# Patient Record
Sex: Female | Born: 1955 | Race: White | Hispanic: No | Smoking: Never smoker
Health system: Southern US, Community
[De-identification: ages and names within clinical notes are randomized; demographics above are authoritative.]

## PROBLEM LIST (undated history)

## (undated) DIAGNOSIS — E119 Type 2 diabetes mellitus without complications: Secondary | ICD-10-CM

## (undated) DIAGNOSIS — E039 Hypothyroidism, unspecified: Secondary | ICD-10-CM

## (undated) DIAGNOSIS — E079 Disorder of thyroid, unspecified: Secondary | ICD-10-CM

## (undated) DIAGNOSIS — E785 Hyperlipidemia, unspecified: Secondary | ICD-10-CM

## (undated) HISTORY — DX: Hyperlipidemia, unspecified: E78.5

## (undated) HISTORY — DX: Disorder of thyroid, unspecified: E07.9

## (undated) HISTORY — DX: Type 2 diabetes mellitus without complications: E11.9

## (undated) HISTORY — PX: COLONOSCOPY: SHX174

---

## 2001-09-20 ENCOUNTER — Encounter: Payer: Self-pay | Admitting: Obstetrics and Gynecology

## 2001-09-20 ENCOUNTER — Ambulatory Visit (HOSPITAL_COMMUNITY): Admission: RE | Admit: 2001-09-20 | Discharge: 2001-09-20 | Payer: Self-pay | Admitting: Obstetrics and Gynecology

## 2002-09-21 ENCOUNTER — Encounter: Payer: Self-pay | Admitting: Obstetrics & Gynecology

## 2002-09-21 ENCOUNTER — Ambulatory Visit (HOSPITAL_COMMUNITY): Admission: RE | Admit: 2002-09-21 | Discharge: 2002-09-21 | Payer: Self-pay | Admitting: Obstetrics & Gynecology

## 2004-10-22 ENCOUNTER — Ambulatory Visit (HOSPITAL_COMMUNITY): Admission: RE | Admit: 2004-10-22 | Discharge: 2004-10-22 | Payer: Self-pay | Admitting: Obstetrics and Gynecology

## 2006-05-11 ENCOUNTER — Ambulatory Visit (HOSPITAL_COMMUNITY): Admission: RE | Admit: 2006-05-11 | Discharge: 2006-05-11 | Payer: Self-pay | Admitting: Obstetrics and Gynecology

## 2007-10-31 ENCOUNTER — Ambulatory Visit (HOSPITAL_COMMUNITY): Admission: RE | Admit: 2007-10-31 | Discharge: 2007-10-31 | Payer: Self-pay | Admitting: Obstetrics and Gynecology

## 2007-11-21 ENCOUNTER — Other Ambulatory Visit: Admission: RE | Admit: 2007-11-21 | Discharge: 2007-11-21 | Payer: Self-pay | Admitting: Obstetrics and Gynecology

## 2009-12-25 ENCOUNTER — Ambulatory Visit (HOSPITAL_COMMUNITY): Admission: RE | Admit: 2009-12-25 | Discharge: 2009-12-25 | Payer: Self-pay | Admitting: Obstetrics and Gynecology

## 2010-02-18 ENCOUNTER — Other Ambulatory Visit: Admission: RE | Admit: 2010-02-18 | Discharge: 2010-02-18 | Payer: Self-pay | Admitting: Obstetrics and Gynecology

## 2011-08-05 ENCOUNTER — Other Ambulatory Visit (HOSPITAL_COMMUNITY): Payer: Self-pay | Admitting: Physician Assistant

## 2011-08-05 DIAGNOSIS — M5137 Other intervertebral disc degeneration, lumbosacral region: Secondary | ICD-10-CM

## 2011-08-05 DIAGNOSIS — Z8546 Personal history of malignant neoplasm of prostate: Secondary | ICD-10-CM

## 2011-08-05 DIAGNOSIS — IMO0002 Reserved for concepts with insufficient information to code with codable children: Secondary | ICD-10-CM

## 2011-08-05 DIAGNOSIS — M51379 Other intervertebral disc degeneration, lumbosacral region without mention of lumbar back pain or lower extremity pain: Secondary | ICD-10-CM

## 2012-02-16 ENCOUNTER — Other Ambulatory Visit: Payer: Self-pay | Admitting: Obstetrics and Gynecology

## 2012-02-16 DIAGNOSIS — IMO0001 Reserved for inherently not codable concepts without codable children: Secondary | ICD-10-CM

## 2012-02-21 ENCOUNTER — Ambulatory Visit (HOSPITAL_COMMUNITY)
Admission: RE | Admit: 2012-02-21 | Discharge: 2012-02-21 | Disposition: A | Discharge status: Home / Self Care | Payer: BC Managed Care – PPO | Source: Ambulatory Visit | Attending: Obstetrics and Gynecology | Admitting: Obstetrics and Gynecology

## 2012-02-21 DIAGNOSIS — IMO0001 Reserved for inherently not codable concepts without codable children: Secondary | ICD-10-CM

## 2012-02-21 DIAGNOSIS — Z1231 Encounter for screening mammogram for malignant neoplasm of breast: Secondary | ICD-10-CM | POA: Insufficient documentation

## 2012-03-13 ENCOUNTER — Other Ambulatory Visit: Payer: Self-pay | Admitting: Adult Health

## 2012-03-13 ENCOUNTER — Other Ambulatory Visit (HOSPITAL_COMMUNITY)
Admission: RE | Admit: 2012-03-13 | Discharge: 2012-03-13 | Disposition: A | Discharge status: Home / Self Care | Payer: BC Managed Care – PPO | Source: Ambulatory Visit | Attending: Obstetrics and Gynecology | Admitting: Obstetrics and Gynecology

## 2012-03-13 DIAGNOSIS — Z1151 Encounter for screening for human papillomavirus (HPV): Secondary | ICD-10-CM | POA: Insufficient documentation

## 2012-03-13 DIAGNOSIS — Z01419 Encounter for gynecological examination (general) (routine) without abnormal findings: Secondary | ICD-10-CM | POA: Insufficient documentation

## 2014-09-05 ENCOUNTER — Other Ambulatory Visit: Payer: Self-pay | Admitting: Obstetrics and Gynecology

## 2014-09-05 DIAGNOSIS — Z1231 Encounter for screening mammogram for malignant neoplasm of breast: Secondary | ICD-10-CM

## 2014-09-11 ENCOUNTER — Ambulatory Visit (HOSPITAL_COMMUNITY)
Admission: RE | Admit: 2014-09-11 | Discharge: 2014-09-11 | Disposition: A | Discharge status: Home / Self Care | Payer: BLUE CROSS/BLUE SHIELD | Source: Ambulatory Visit | Attending: Obstetrics and Gynecology | Admitting: Obstetrics and Gynecology

## 2014-09-11 DIAGNOSIS — Z1231 Encounter for screening mammogram for malignant neoplasm of breast: Secondary | ICD-10-CM | POA: Diagnosis present

## 2014-09-13 ENCOUNTER — Other Ambulatory Visit: Payer: Self-pay | Admitting: Obstetrics and Gynecology

## 2014-09-13 DIAGNOSIS — IMO0002 Reserved for concepts with insufficient information to code with codable children: Secondary | ICD-10-CM

## 2014-09-13 DIAGNOSIS — R229 Localized swelling, mass and lump, unspecified: Principal | ICD-10-CM

## 2014-09-17 ENCOUNTER — Other Ambulatory Visit: Payer: Self-pay | Admitting: Obstetrics and Gynecology

## 2014-09-17 ENCOUNTER — Ambulatory Visit (HOSPITAL_COMMUNITY)
Admission: RE | Admit: 2014-09-17 | Discharge: 2014-09-17 | Disposition: A | Discharge status: Home / Self Care | Payer: BLUE CROSS/BLUE SHIELD | Source: Ambulatory Visit | Attending: Obstetrics and Gynecology | Admitting: Obstetrics and Gynecology

## 2014-09-17 DIAGNOSIS — R229 Localized swelling, mass and lump, unspecified: Principal | ICD-10-CM

## 2014-09-17 DIAGNOSIS — R928 Other abnormal and inconclusive findings on diagnostic imaging of breast: Secondary | ICD-10-CM | POA: Insufficient documentation

## 2014-09-17 DIAGNOSIS — IMO0002 Reserved for concepts with insufficient information to code with codable children: Secondary | ICD-10-CM

## 2015-01-29 ENCOUNTER — Encounter: Payer: Self-pay | Admitting: Adult Health

## 2015-01-29 ENCOUNTER — Ambulatory Visit (INDEPENDENT_AMBULATORY_CARE_PROVIDER_SITE_OTHER): Payer: BLUE CROSS/BLUE SHIELD | Admitting: Adult Health

## 2015-01-29 ENCOUNTER — Other Ambulatory Visit (HOSPITAL_COMMUNITY)
Admission: RE | Admit: 2015-01-29 | Discharge: 2015-01-29 | Disposition: A | Discharge status: Home / Self Care | Payer: BLUE CROSS/BLUE SHIELD | Source: Ambulatory Visit | Attending: Adult Health | Admitting: Adult Health

## 2015-01-29 VITALS — BP 120/72 | HR 64 | Ht 65.25 in | Wt 154.0 lb

## 2015-01-29 DIAGNOSIS — Z01419 Encounter for gynecological examination (general) (routine) without abnormal findings: Secondary | ICD-10-CM

## 2015-01-29 DIAGNOSIS — Z1212 Encounter for screening for malignant neoplasm of rectum: Secondary | ICD-10-CM | POA: Diagnosis not present

## 2015-01-29 DIAGNOSIS — Z1151 Encounter for screening for human papillomavirus (HPV): Secondary | ICD-10-CM | POA: Diagnosis present

## 2015-01-29 LAB — HEMOCCULT GUIAC POC 1CARD (OFFICE): Fecal Occult Blood, POC: NEGATIVE

## 2015-01-29 NOTE — Progress Notes (Signed)
Patient ID: Krista Lambert, female   DOB: 11-25-1955, 59 y.o.   MRN: [id number] History of Present Illness: Krista Lambert is a 59 year old white female, married in for well woman gyn exam and pap. PCP is Dr Scotty Court.   Current Medications, Allergies, Past Medical History, Past Surgical History, Family History and Social History were reviewed in [employer] record.     Review of Systems: Patient denies any headaches, hearing loss, fatigue, blurred vision, shortness of breath, chest pain, abdominal pain, problems with bowel movements, urination, or intercourse. No joint pain or mood swings.    Physical Exam:BP 120/72 mmHg  Pulse 64  Ht 5' 5.25" (1.657 m)  Wt 154 lb (69.854 kg)  BMI 25.44 kg/m2 General:  Well developed, well nourished, no acute distress Skin:  Warm and dry Neck:  Midline trachea, normal thyroid, good ROM, no lymphadenopathy, no carotid bruits heard Lungs; Clear to auscultation bilaterally Breast:  No dominant palpable mass, retraction, or nipple discharge Cardiovascular: Regular rate and rhythm Abdomen:  Soft, non tender, no hepatosplenomegaly Pelvic:  External genitalia is normal in appearance, no lesions.  The vagina is normal in appearance. Urethra has no lesions or masses. The cervix is smooth and  bulbous, pap with HPV performed. Has first degree descensus. Uterus is felt to be normal size, shape, and contour.  No adnexal masses or tenderness noted.Bladder is non tender, no masses felt. Rectal: Good sphincter tone, no polyps, or hemorrhoids felt.  Hemoccult negative. Extremities/musculoskeletal:  No swelling or varicosities noted, no clubbing or cyanosis Psych:  No mood changes, alert and cooperative,seems happy   Impression: Well woman gyn exam with pap    Plan: Physical in 1 year Pap in 3 if normal pap and negative HPV Mammogram yearly Labs with PCP Colonoscopy per GI DEXA advised, can talk with Dr Scotty Court

## 2015-01-29 NOTE — Patient Instructions (Signed)
Physical in 1 year Pap in 3 years if normal with negative HPV Mammogram yearly Labs with PCP Colonoscopy per GI DEXA ---bone density recommended

## 2015-01-30 LAB — CYTOLOGY - PAP

## 2016-04-27 ENCOUNTER — Other Ambulatory Visit: Payer: Self-pay | Admitting: Obstetrics & Gynecology

## 2016-04-27 DIAGNOSIS — Z1231 Encounter for screening mammogram for malignant neoplasm of breast: Secondary | ICD-10-CM

## 2016-05-07 ENCOUNTER — Ambulatory Visit (INDEPENDENT_AMBULATORY_CARE_PROVIDER_SITE_OTHER): Payer: BLUE CROSS/BLUE SHIELD | Admitting: Adult Health

## 2016-05-07 ENCOUNTER — Ambulatory Visit (HOSPITAL_COMMUNITY)
Admission: RE | Admit: 2016-05-07 | Discharge: 2016-05-07 | Disposition: A | Discharge status: Home / Self Care | Payer: BLUE CROSS/BLUE SHIELD | Source: Ambulatory Visit | Attending: Obstetrics & Gynecology | Admitting: Obstetrics & Gynecology

## 2016-05-07 ENCOUNTER — Encounter: Payer: Self-pay | Admitting: Adult Health

## 2016-05-07 VITALS — BP 110/70 | HR 72 | Ht 65.25 in | Wt 153.0 lb

## 2016-05-07 DIAGNOSIS — Z1231 Encounter for screening mammogram for malignant neoplasm of breast: Secondary | ICD-10-CM | POA: Insufficient documentation

## 2016-05-07 DIAGNOSIS — Z01419 Encounter for gynecological examination (general) (routine) without abnormal findings: Secondary | ICD-10-CM

## 2016-05-07 DIAGNOSIS — Z1212 Encounter for screening for malignant neoplasm of rectum: Secondary | ICD-10-CM

## 2016-05-07 LAB — HEMOCCULT GUIAC POC 1CARD (OFFICE): FECAL OCCULT BLD: NEGATIVE

## 2016-05-07 NOTE — Progress Notes (Signed)
Patient ID: Krista Lambert, female   DOB: 1956-02-03, 60 y.o.   MRN: [id number] History of Present Illness: Eztli is a 60 year old white female,married in for a well woman gyn exam, she had a normal pap with negative HPV 01/29/15.  PCP is Dr Scotty Court.    Current Medications, Allergies, Past Medical History, Past Surgical History, Family History and Social History were reviewed in [employer] record.     Review of Systems: Patient denies any headaches, hearing loss, fatigue, blurred vision, shortness of breath, chest pain, abdominal pain, problems with bowel movements, urination, or intercourse. No joint pain or mood swings.    Physical Exam:BP 110/70 (BP Location: Left Arm, Patient Position: Sitting, Cuff Size: Normal)   Pulse 72   Ht 5' 5.25" (1.657 m)   Wt 153 lb (69.4 kg)   BMI 25.27 kg/m  General:  Well developed, well nourished, no acute distress Skin:  Warm and dry Neck:  Midline trachea, normal thyroid, good ROM, no lymphadenopathy,no carotid bruits heard Lungs; Clear to auscultation bilaterally Breast:  No dominant palpable mass, retraction, or nipple discharge Cardiovascular: Regular rate and rhythm Abdomen:  Soft, non tender, no hepatosplenomegaly Pelvic:  External genitalia is normal in appearance, no lesions.  The vagina is normal in appearance. Urethra has no lesions or masses. The cervix is bulbous, with some descensus.  Uterus is felt to be normal size, shape, and contour.  No adnexal masses or tenderness noted.Bladder is non tender, no masses felt. Rectal: Good sphincter tone, no polyps, or hemorrhoids felt.  Hemoccult negative. Extremities/musculoskeletal:  No swelling or varicosities noted, no clubbing or cyanosis Psych:  No mood changes, alert and cooperative,seems happy PHQ 2 score 0.  Impression: 1. Well woman exam with routine gynecological exam       Plan: Physical in 1 year Pap in 2019 Mammogram yearly(had today) Colonoscopy per  GI Labs with PCP

## 2016-05-07 NOTE — Patient Instructions (Signed)
Physical in 1 year Pap in 2019 Mammogram yearly Colonoscopy per GI Labs with PCP

## 2016-05-07 NOTE — Progress Notes (Signed)
Patient ID: Krista Lambert, female   DOB: 1955-07-02, 60 y.o.   MRN: [id number]

## 2017-11-22 ENCOUNTER — Other Ambulatory Visit: Payer: Self-pay | Admitting: Obstetrics & Gynecology

## 2017-11-22 DIAGNOSIS — Z1231 Encounter for screening mammogram for malignant neoplasm of breast: Secondary | ICD-10-CM

## 2017-11-30 ENCOUNTER — Encounter (HOSPITAL_COMMUNITY): Payer: Self-pay

## 2017-11-30 ENCOUNTER — Ambulatory Visit (HOSPITAL_COMMUNITY)
Admission: RE | Admit: 2017-11-30 | Discharge: 2017-11-30 | Disposition: A | Discharge status: Home / Self Care | Payer: BLUE CROSS/BLUE SHIELD | Source: Ambulatory Visit | Attending: Obstetrics & Gynecology | Admitting: Obstetrics & Gynecology

## 2017-11-30 DIAGNOSIS — Z1231 Encounter for screening mammogram for malignant neoplasm of breast: Secondary | ICD-10-CM | POA: Diagnosis present

## 2019-01-10 ENCOUNTER — Other Ambulatory Visit (HOSPITAL_COMMUNITY): Payer: Self-pay | Admitting: Obstetrics & Gynecology

## 2019-01-10 DIAGNOSIS — Z1231 Encounter for screening mammogram for malignant neoplasm of breast: Secondary | ICD-10-CM

## 2019-08-30 ENCOUNTER — Ambulatory Visit (HOSPITAL_COMMUNITY)
Admission: RE | Admit: 2019-08-30 | Discharge: 2019-08-30 | Disposition: A | Discharge status: Home / Self Care | Payer: BC Managed Care – PPO | Source: Ambulatory Visit | Attending: Obstetrics & Gynecology | Admitting: Obstetrics & Gynecology

## 2019-08-30 ENCOUNTER — Other Ambulatory Visit: Payer: Self-pay

## 2019-08-30 DIAGNOSIS — Z1231 Encounter for screening mammogram for malignant neoplasm of breast: Secondary | ICD-10-CM | POA: Diagnosis not present

## 2019-09-01 ENCOUNTER — Ambulatory Visit: Payer: BC Managed Care – PPO | Attending: Internal Medicine

## 2019-09-01 DIAGNOSIS — Z23 Encounter for immunization: Secondary | ICD-10-CM | POA: Insufficient documentation

## 2019-09-01 NOTE — Progress Notes (Signed)
   Covid-19 Vaccination Clinic  Name:  Krista Lambert    MRN: [id number] DOB: 1955-09-23  09/01/2019  [name] was observed post Covid-19 immunization for 15 minutes without incident. She was provided with Vaccine Information Sheet and instruction to access the V-Safe system.   [name] was instructed to call 911 with any severe reactions post vaccine: Marland Kitchen Difficulty breathing  . Swelling of face and throat  . A fast heartbeat  . A bad rash all over body  . Dizziness and weakness   Immunizations Administered    Name Date Dose VIS Date Route   Moderna COVID-19 Vaccine 09/01/2019  8:56 AM 0.5 mL 05/29/2019 Intramuscular   Manufacturer: Moderna   Lot: [id number]   [place]:[id number]

## 2019-10-03 ENCOUNTER — Ambulatory Visit: Payer: BC Managed Care – PPO | Attending: Internal Medicine

## 2019-10-03 DIAGNOSIS — Z23 Encounter for immunization: Secondary | ICD-10-CM

## 2019-10-03 NOTE — Progress Notes (Signed)
   Covid-19 Vaccination Clinic  Name:  Krista Lambert    MRN: [id number] DOB: 1955-08-16  10/03/2019  Krista Lambert was observed post Covid-19 immunization for 15 minutes without incident. She was provided with Vaccine Information Sheet and instruction to access the V-Safe system.   Krista Lambert was instructed to call 911 with any severe reactions post vaccine: Marland Kitchen Difficulty breathing  . Swelling of face and throat  . A fast heartbeat  . A bad rash all over body  . Dizziness and weakness   Immunizations Administered    Name Date Dose VIS Date Route   Moderna COVID-19 Vaccine 10/03/2019  8:40 AM 0.5 mL 05/29/2019 Intramuscular   Manufacturer: Levan Hurst   LotUD:[id number]   [place][id number]

## 2020-01-24 ENCOUNTER — Encounter (INDEPENDENT_AMBULATORY_CARE_PROVIDER_SITE_OTHER): Payer: Self-pay | Admitting: *Deleted

## 2020-02-26 ENCOUNTER — Encounter (INDEPENDENT_AMBULATORY_CARE_PROVIDER_SITE_OTHER): Payer: Self-pay | Admitting: *Deleted

## 2020-02-26 ENCOUNTER — Telehealth (INDEPENDENT_AMBULATORY_CARE_PROVIDER_SITE_OTHER): Payer: Self-pay | Admitting: *Deleted

## 2020-02-26 ENCOUNTER — Other Ambulatory Visit (INDEPENDENT_AMBULATORY_CARE_PROVIDER_SITE_OTHER): Payer: Self-pay | Admitting: *Deleted

## 2020-02-26 MED ORDER — PLENVU 140 G PO SOLR: 1.0000 | Freq: Once | ORAL | 0 refills | Status: AC

## 2020-02-26 NOTE — Telephone Encounter (Addendum)
Patient needs Plenvu (copay card) ° °

## 2020-03-07 ENCOUNTER — Other Ambulatory Visit (INDEPENDENT_AMBULATORY_CARE_PROVIDER_SITE_OTHER): Payer: Self-pay

## 2020-03-07 DIAGNOSIS — Z1211 Encounter for screening for malignant neoplasm of colon: Secondary | ICD-10-CM

## 2020-03-10 ENCOUNTER — Telehealth (INDEPENDENT_AMBULATORY_CARE_PROVIDER_SITE_OTHER): Payer: Self-pay | Admitting: *Deleted

## 2020-03-10 NOTE — Telephone Encounter (Signed)
Ok to schedule.  Thanks,  Chayton Murata Castaneda Mayorga, MD Gastroenterology and Hepatology [facility]  

## 2020-03-10 NOTE — Telephone Encounter (Signed)
Referring MD/PCP: rucker   Procedure: tcs  Reason/Indication:  screening  Has patient had this procedure before?  Yes, 2013  If so, when, by whom and where?    Is there a family history of colon cancer?  no  Who?  What age when diagnosed?    Is patient diabetic?   yes      Does patient have prosthetic heart valve or mechanical valve?  no  Do you have a pacemaker/defibrillator?  no  Has patient ever had endocarditis/atrial fibrillation? no  Does patient use oxygen? no  Has patient had joint replacement within last 12 months?  no  Is patient constipated or do they take laxatives? no  Does patient have a history of alcohol/drug use?  no  Is patient on blood thinner such as Coumadin, Plavix and/or Aspirin? yes  Medications: asa 81 mg daily, ibuprofen 200 mg bid, metformin 500 mg bid, levothyroxine 75 mcg daily, rosuvastatin 20 mg daily, mvi daily, co 1 10 daily, vit c daily, vit b12 daily, allegra daily, flonase daily, senokot daily, colace 100 mg 3 tab after evening meal  Allergies: miralax  Medication Adjustment per Dr Rehman/Dr Jenetta Downer hold metformin evening before and morning of proceudre  Procedure date & time: 04/08/20

## 2020-04-07 ENCOUNTER — Other Ambulatory Visit (HOSPITAL_COMMUNITY)
Admission: RE | Admit: 2020-04-07 | Discharge: 2020-04-07 | Disposition: A | Discharge status: Home / Self Care | Payer: BC Managed Care – PPO | Source: Ambulatory Visit | Attending: Gastroenterology | Admitting: Gastroenterology

## 2020-04-07 ENCOUNTER — Other Ambulatory Visit: Payer: Self-pay

## 2020-04-07 DIAGNOSIS — Z01812 Encounter for preprocedural laboratory examination: Secondary | ICD-10-CM | POA: Insufficient documentation

## 2020-04-07 DIAGNOSIS — Z20822 Contact with and (suspected) exposure to covid-19: Secondary | ICD-10-CM | POA: Diagnosis not present

## 2020-04-07 LAB — SARS CORONAVIRUS 2 (TAT 6-24 HRS): SARS Coronavirus 2: NEGATIVE

## 2020-04-08 ENCOUNTER — Other Ambulatory Visit (INDEPENDENT_AMBULATORY_CARE_PROVIDER_SITE_OTHER): Payer: Self-pay | Admitting: Gastroenterology

## 2020-04-08 ENCOUNTER — Other Ambulatory Visit: Payer: Self-pay

## 2020-04-08 ENCOUNTER — Ambulatory Visit (HOSPITAL_COMMUNITY): Payer: BC Managed Care – PPO | Admitting: Anesthesiology

## 2020-04-08 ENCOUNTER — Ambulatory Visit (HOSPITAL_COMMUNITY)
Admission: RE | Admit: 2020-04-08 | Discharge: 2020-04-08 | Disposition: A | Discharge status: Home / Self Care | Payer: BC Managed Care – PPO | Attending: Gastroenterology | Admitting: Gastroenterology

## 2020-04-08 ENCOUNTER — Encounter (HOSPITAL_COMMUNITY): Payer: Self-pay | Admitting: Gastroenterology

## 2020-04-08 ENCOUNTER — Encounter (HOSPITAL_COMMUNITY)
Admission: RE | Disposition: A | Discharge status: Home / Self Care | Payer: Self-pay | Source: Home / Self Care | Attending: Gastroenterology

## 2020-04-08 DIAGNOSIS — Z86010 Personal history of colonic polyps: Secondary | ICD-10-CM | POA: Diagnosis not present

## 2020-04-08 DIAGNOSIS — E039 Hypothyroidism, unspecified: Secondary | ICD-10-CM | POA: Diagnosis not present

## 2020-04-08 DIAGNOSIS — Z79899 Other long term (current) drug therapy: Secondary | ICD-10-CM | POA: Insufficient documentation

## 2020-04-08 DIAGNOSIS — Z7984 Long term (current) use of oral hypoglycemic drugs: Secondary | ICD-10-CM | POA: Insufficient documentation

## 2020-04-08 DIAGNOSIS — K5730 Diverticulosis of large intestine without perforation or abscess without bleeding: Secondary | ICD-10-CM | POA: Diagnosis not present

## 2020-04-08 DIAGNOSIS — D125 Benign neoplasm of sigmoid colon: Secondary | ICD-10-CM | POA: Insufficient documentation

## 2020-04-08 DIAGNOSIS — Z791 Long term (current) use of non-steroidal anti-inflammatories (NSAID): Secondary | ICD-10-CM | POA: Insufficient documentation

## 2020-04-08 DIAGNOSIS — K648 Other hemorrhoids: Secondary | ICD-10-CM | POA: Insufficient documentation

## 2020-04-08 DIAGNOSIS — Z7982 Long term (current) use of aspirin: Secondary | ICD-10-CM | POA: Insufficient documentation

## 2020-04-08 DIAGNOSIS — Z79890 Hormone replacement therapy (postmenopausal): Secondary | ICD-10-CM | POA: Diagnosis not present

## 2020-04-08 DIAGNOSIS — Z1211 Encounter for screening for malignant neoplasm of colon: Secondary | ICD-10-CM | POA: Insufficient documentation

## 2020-04-08 DIAGNOSIS — E119 Type 2 diabetes mellitus without complications: Secondary | ICD-10-CM | POA: Insufficient documentation

## 2020-04-08 DIAGNOSIS — Z888 Allergy status to other drugs, medicaments and biological substances status: Secondary | ICD-10-CM | POA: Diagnosis not present

## 2020-04-08 DIAGNOSIS — Z8379 Family history of other diseases of the digestive system: Secondary | ICD-10-CM | POA: Insufficient documentation

## 2020-04-08 DIAGNOSIS — E785 Hyperlipidemia, unspecified: Secondary | ICD-10-CM | POA: Diagnosis not present

## 2020-04-08 DIAGNOSIS — Z539 Procedure and treatment not carried out, unspecified reason: Secondary | ICD-10-CM | POA: Diagnosis not present

## 2020-04-08 HISTORY — DX: Hypothyroidism, unspecified: E03.9

## 2020-04-08 HISTORY — PX: POLYPECTOMY: SHX5525

## 2020-04-08 HISTORY — PX: FLEXIBLE SIGMOIDOSCOPY: SHX5431

## 2020-04-08 LAB — GLUCOSE, CAPILLARY: Glucose-Capillary: 118 mg/dL — ABNORMAL HIGH (ref 70–99)

## 2020-04-08 SURGERY — POLYPECTOMY
Anesthesia: General

## 2020-04-08 MED ORDER — LIDOCAINE HCL (CARDIAC) PF 100 MG/5ML IV SOSY
PREFILLED_SYRINGE | INTRAVENOUS | Status: DC | PRN
  Administered 2020-04-08: 80 mg via INTRAVENOUS

## 2020-04-08 MED ORDER — LACTATED RINGERS IV SOLN: INTRAVENOUS | Status: DC | PRN

## 2020-04-08 MED ORDER — PROPOFOL 500 MG/50ML IV EMUL
INTRAVENOUS | Status: DC | PRN
  Administered 2020-04-08: 125 ug/kg/min via INTRAVENOUS

## 2020-04-08 MED ORDER — LACTATED RINGERS IV SOLN: Freq: Once | INTRAVENOUS | Status: AC

## 2020-04-08 MED ORDER — SODIUM CHLORIDE (PF) 0.9 % IJ SOLN
PREFILLED_SYRINGE | INTRAMUSCULAR | Status: DC | PRN
  Administered 2020-04-08: .5 mL

## 2020-04-08 MED ORDER — BISACODYL EC 5 MG PO TBEC: 10.0000 mg | DELAYED_RELEASE_TABLET | Freq: Every day | ORAL | 0 refills | Status: DC

## 2020-04-08 MED ORDER — PLENVU 140 G PO SOLR: 1.0000 | Freq: Once | ORAL | 0 refills | Status: AC

## 2020-04-08 MED ORDER — CHLORHEXIDINE GLUCONATE CLOTH 2 % EX PADS: 6.0000 | MEDICATED_PAD | Freq: Once | CUTANEOUS | Status: DC

## 2020-04-08 MED ORDER — STERILE WATER FOR IRRIGATION IR SOLN
Status: DC | PRN
  Administered 2020-04-08: 1.5 mL

## 2020-04-08 MED ORDER — EPINEPHRINE PF 1 MG/ML IJ SOLN
INTRAMUSCULAR | Status: AC
  Filled 2020-04-08: qty 1

## 2020-04-08 MED ORDER — PROPOFOL 10 MG/ML IV BOLUS
INTRAVENOUS | Status: DC | PRN
  Administered 2020-04-08: 50 mg via INTRAVENOUS
  Administered 2020-04-08: 30 mg via INTRAVENOUS

## 2020-04-08 NOTE — Op Note (Addendum)
[facility] Patient Name: Krista Lambert Procedure Date: 04/08/2020 8:28 AM MRN: [id number] Date of Birth: Nov 21, 1955 Attending MD: Maylon Peppers ,  CSN: [id number] Age: 64 Admit Type: Outpatient Procedure:                Flexible Sigmoidoscopy Indications:              High risk colon cancer surveillance: Personal                            history of colonic polyps Providers:                Maylon Peppers, Charlsie Quest. Theda Sers RN, RN, Aram Candela Referring MD:              Medicines:                Monitored Anesthesia Care Complications:            No immediate complications. Estimated Blood Loss:     Estimated blood loss: none. Procedure:                Pre-Anesthesia Assessment:                           - Prior to the procedure, a History and Physical                            was performed, and patient medications, allergies                            and sensitivities were reviewed. The patient's                            tolerance of previous anesthesia was reviewed.                           - The risks and benefits of the procedure and the                            sedation options and risks were discussed with the                            patient. All questions were answered and informed                            consent was obtained.                           - ASA Grade Assessment: II - A patient with mild                            systemic disease.                           The PCF-H190DL ([id number]) scope was introduced  through the anus and advanced to the the sigmoid                            colon. The flexible sigmoidoscopy was performed                            with difficulty due to inadequate bowel prep. The                            patient tolerated the procedure well. The quality                            of the bowel preparation was inadequate. Scope In: 9:04:46 AM Scope Out: 9:24:26  AM Total Procedure Duration: 0 hours 19 minutes 40 seconds  Findings:      Hemorrhoids were found on perianal exam.      A moderate amount of semi-solid stool was found in the rectum and in the       sigmoid colon, interfering with visualization.      A 15 mm polyp was found in the sigmoid colon. The polyp was       pedunculated. Area was successfully injected with 0.5 mL of a 1:10,000       solution of epinephrine for prevent bleeding and decrease polyp size.      A 5 to 6 mm polyp was found in the sigmoid colon. The polyp was sessile.       The polyp was removed with a cold snare. Resection and retrieval were       complete.      Multiple large-mouthed diverticula were found in the sigmoid colon.      Non-bleeding internal hemorrhoids were found during retroflexion. The       hemorrhoids were moderate. Impression:               - Preparation of the colon was inadequate.                           - Hemorrhoids found on perianal exam.                           - Stool in the rectum and in the sigmoid colon.                           - One 15 mm polyp in the sigmoid colon. Injected.                           - One 5 to 6 mm polyp in the sigmoid colon, removed                            with a cold snare. Resected and retrieved.                           - Diverticulosis in the sigmoid colon.                           - Non-bleeding internal hemorrhoids. Moderate Sedation:  Per Anesthesia Care Recommendation:           - Discharge patient to home (ambulatory).                           - Clear liquid diet today.                           - Await pathology results.                           - Perform colonoscopy tomorrow for surveillance due                            to prep quality. Procedure Code(s):        --- Professional ---                           [id number], GC, Sigmoidoscopy, flexible; with removal of                            tumor(s), polyp(s), or other lesion(s) by snare                             technique Diagnosis Code(s):        --- Professional ---                           Z86.010, Personal history of colonic polyps                           K64.8, Other hemorrhoids                           K57.30, Diverticulosis of large intestine without                            perforation or abscess without bleeding CPT copyright 2019 American Medical Association. All rights reserved. The codes documented in this report are preliminary and upon coder review may  be revised to meet current compliance requirements. Maylon Peppers, MD Maylon Peppers,  04/08/2020 9:33:46 AM This report has been signed electronically. Number of Addenda: 0

## 2020-04-08 NOTE — H&P (Signed)
Krista Lambert is an 64 y.o. female.   Chief Complaint: Hx of colon polyps HPI: 64 y/o F with PMH diabetes, hyperlipidemia, hypothyroidism, coming to the hospital for endoscopic evaluation of history of colon polyps.  The patient had her most recent colonoscopy in 2013, was found to have 1 rectal polyp.  The patient but no report is available.  She denies having any complaints such as melena, hematochezia, abdominal pain or distention, change in her bowel movement consistency or frequency, no changes in her weight recently.  No family history of colorectal cancer.   Past Medical History:  Diagnosis Date  . Diabetes mellitus without complication ([place])   . Hyperlipidemia   . Hypothyroidism   . Thyroid disease     Past Surgical History:  Procedure Laterality Date  . COLONOSCOPY      Family History  Problem Relation Age of Onset  . Alzheimer's disease Father   . Other Father        bowel obstruction  . Cancer Sister        breast  . Hyperlipidemia Son   . Diabetes Son   . Epilepsy Son   . Other Son        heart issues  . Alzheimer's disease Paternal Aunt   . Alzheimer's disease Paternal Uncle   . Leukemia Maternal Grandmother   . Dementia Maternal Grandmother   . Hypertension Maternal Grandfather   . Cancer Maternal Grandfather        prostate  . Alzheimer's disease Paternal Grandmother   . Alzheimer's disease Paternal Uncle   . Cancer Paternal Uncle        liver  . Hepatitis Paternal Uncle    Social History:  reports that she has never smoked. She has never used smokeless tobacco. She reports current alcohol use. She reports that she does not use drugs.  Allergies:  Allergies  Allergen Reactions  . Polyethylene Glycol 3350 Other (See Comments)    "Ankle swelling"    Medications Prior to Admission  Medication Sig Dispense Refill  . Ascorbic Acid (VITAMIN C) 1000 MG tablet Take 1,000 mg by mouth daily.     Marland Kitchen aspirin 81 MG tablet Take 81 mg by mouth daily.    .  Cholecalciferol (VITAMIN D) 125 MCG (5000 UT) CAPS Take 5,000 Units by mouth daily.     . Coenzyme Q10 (CO Q 10 PO) Take 200 mg by mouth daily.     . Cyanocobalamin (VITAMIN B-12) 5000 MCG SUBL Place 5,000 mcg under the tongue daily.    Marland Kitchen docusate sodium (COLACE) 100 MG capsule Take 100 mg by mouth in the morning, at noon, and at bedtime.     . fexofenadine (ALLEGRA) 180 MG tablet Take 180 mg by mouth daily.    . fluticasone (FLONASE) 50 MCG/ACT nasal spray Place 2 sprays into both nostrils daily.    Marland Kitchen ibuprofen (ADVIL) 200 MG tablet Take 400 mg by mouth at bedtime.    . Levothyroxine Sodium 88 MCG CAPS Take 88 mcg by mouth daily.     . metFORMIN (GLUCOPHAGE-XR) 500 MG 24 hr tablet Take 1,000 mg by mouth daily with breakfast.     . Multiple Vitamin (MULTIVITAMIN) tablet Take 1 tablet by mouth daily. 50+    . Probiotic Product (PROBIOTIC DAILY PO) Take 124 mg by mouth at bedtime.    . rosuvastatin (CRESTOR) 20 MG tablet Take 20 mg by mouth daily.     Marland Kitchen senna-docusate (SENOKOT-S) 8.6-50 MG tablet Take  2 tablets by mouth at bedtime.      Results for orders placed or performed during the hospital encounter of 04/08/20 (from the past 48 hour(s))  Glucose, capillary     Status: Abnormal   Collection Time: 04/08/20  7:12 AM  Result Value Ref Range   Glucose-Capillary 118 (H) 70 - 99 mg/dL    Comment: Glucose reference range applies only to samples taken after fasting for at least 8 hours.   No results found.  Review of Systems  Constitutional: Negative.   HENT: Negative.   Eyes: Negative.   Respiratory: Negative.   Cardiovascular: Negative.   Gastrointestinal: Negative.   Endocrine: Negative.   Genitourinary: Negative.   Musculoskeletal: Negative.   Skin: Negative.   Allergic/Immunologic: Negative.   Neurological: Negative.   Hematological: Negative.   Psychiatric/Behavioral: Negative.     Blood pressure (!) 147/88, temperature 98.1 F (36.7 C), temperature source Oral, resp. rate  19, height 5\' 5"  (1.651 m), weight 68 kg, SpO2 99 %. Physical Exam  GENERAL: The patient is AO x3, in no acute distress. HEENT: Head is normocephalic and atraumatic. EOMI are intact. Mouth is well hydrated and without lesions. NECK: Supple. No masses LUNGS: Clear to auscultation. No presence of rhonchi/wheezing/rales. Adequate chest expansion HEART: RRR, normal s1 and s2. ABDOMEN: Soft, nontender, no guarding, no peritoneal signs, and nondistended. BS +. No masses. EXTREMITIES: Without any cyanosis, clubbing, rash, lesions or edema. NEUROLOGIC: AOx3, no focal motor deficit. SKIN: no jaundice, no rashes  Assessment/Plan 64 y/o F with PMH diabetes, hyperlipidemia, hypothyroidism, coming to the hospital for endoscopic evaluation of history of colon polyps.  We will proceed with colonoscopy today.  Harvel Quale, MD 04/08/2020, 8:48 AM

## 2020-04-08 NOTE — Discharge Instructions (Signed)
You are being discharged to home.  Take a clear liquid diet today.  We are waiting for your pathology results.  Your physician has recommended a colonoscopy tomorrow for surveillance due to prep quality.    Colonoscopy, Adult, Care After This sheet gives you information about how to care for yourself after your procedure. Your health care provider may also give you more specific instructions. If you have problems or questions, contact your health care provider. What can I expect after the procedure? After the procedure, it is common to have:  A small amount of blood in your stool for 24 hours after the procedure.  Some gas.  Mild cramping or bloating of your abdomen. Follow these instructions at home: Eating and drinking   Drink enough fluid to keep your urine pale yellow.     CLEAR LIQUIDS ONLY TODAY Activity  Rest as told by your health care provider.  Ask for help if you feel weak or unsteady.  Managing cramping and bloating   Try walking around when you have cramps or feel bloated.  General instructions  For the first 24 hours after the procedure: ? Do not drive or use machinery. ? Do not sign important documents. ? Do not drink alcohol. ? Do your regular daily activities at a slower pace than normal.   Take over-the-counter and prescription medicines only as told by your health care provider.  Keep all follow-up visits as told by your health care provider. This is important. Contact a health care provider if:  You have blood in your stool 2-3 days after the procedure. Get help right away if you have:  More than a small spotting of blood in your stool.  Large blood clots in your stool.  Swelling of your abdomen.  Nausea or vomiting.  A fever.  Increasing pain in your abdomen that is not relieved with medicine. Summary  After the procedure, it is common to have a small amount of blood in your stool. You may also have mild cramping and bloating of your  abdomen.  For the first 24 hours after the procedure, do not drive or use machinery, sign important documents, or drink alcohol.  Get help right away if you have a lot of blood in your stool, nausea or vomiting, a fever, or increased pain in your abdomen. This information is not intended to replace advice given to you by your health care provider. Make sure you discuss any questions you have with your health care provider. Document Revised: 01/08/2019 Document Reviewed: 01/08/2019 Elsevier Patient Education  [place].

## 2020-04-08 NOTE — H&P (View-Only) (Signed)
Krista Lambert is an 64 y.o. female.   Chief Complaint: Hx of colon polyps HPI: 64 y/o F with PMH diabetes, hyperlipidemia, hypothyroidism, coming to the hospital for endoscopic evaluation of history of colon polyps.  The patient had her most recent colonoscopy in 2013, was found to have 1 rectal polyp.  The patient but no report is available.  She denies having any complaints such as melena, hematochezia, abdominal pain or distention, change in her bowel movement consistency or frequency, no changes in her weight recently.  No family history of colorectal cancer.   Past Medical History:  Diagnosis Date  . Diabetes mellitus without complication ([place])   . Hyperlipidemia   . Hypothyroidism   . Thyroid disease     Past Surgical History:  Procedure Laterality Date  . COLONOSCOPY      Family History  Problem Relation Age of Onset  . Alzheimer's disease Father   . Other Father        bowel obstruction  . Cancer Sister        breast  . Hyperlipidemia Son   . Diabetes Son   . Epilepsy Son   . Other Son        heart issues  . Alzheimer's disease Paternal Aunt   . Alzheimer's disease Paternal Uncle   . Leukemia Maternal Grandmother   . Dementia Maternal Grandmother   . Hypertension Maternal Grandfather   . Cancer Maternal Grandfather        prostate  . Alzheimer's disease Paternal Grandmother   . Alzheimer's disease Paternal Uncle   . Cancer Paternal Uncle        liver  . Hepatitis Paternal Uncle    Social History:  reports that she has never smoked. She has never used smokeless tobacco. She reports current alcohol use. She reports that she does not use drugs.  Allergies:  Allergies  Allergen Reactions  . Polyethylene Glycol 3350 Other (See Comments)    "Ankle swelling"    Medications Prior to Admission  Medication Sig Dispense Refill  . Ascorbic Acid (VITAMIN C) 1000 MG tablet Take 1,000 mg by mouth daily.     Marland Kitchen aspirin 81 MG tablet Take 81 mg by mouth daily.    .  Cholecalciferol (VITAMIN D) 125 MCG (5000 UT) CAPS Take 5,000 Units by mouth daily.     . Coenzyme Q10 (CO Q 10 PO) Take 200 mg by mouth daily.     . Cyanocobalamin (VITAMIN B-12) 5000 MCG SUBL Place 5,000 mcg under the tongue daily.    Marland Kitchen docusate sodium (COLACE) 100 MG capsule Take 100 mg by mouth in the morning, at noon, and at bedtime.     . fexofenadine (ALLEGRA) 180 MG tablet Take 180 mg by mouth daily.    . fluticasone (FLONASE) 50 MCG/ACT nasal spray Place 2 sprays into both nostrils daily.    Marland Kitchen ibuprofen (ADVIL) 200 MG tablet Take 400 mg by mouth at bedtime.    . Levothyroxine Sodium 88 MCG CAPS Take 88 mcg by mouth daily.     . metFORMIN (GLUCOPHAGE-XR) 500 MG 24 hr tablet Take 1,000 mg by mouth daily with breakfast.     . Multiple Vitamin (MULTIVITAMIN) tablet Take 1 tablet by mouth daily. 50+    . Probiotic Product (PROBIOTIC DAILY PO) Take 124 mg by mouth at bedtime.    . rosuvastatin (CRESTOR) 20 MG tablet Take 20 mg by mouth daily.     Marland Kitchen senna-docusate (SENOKOT-S) 8.6-50 MG tablet Take  2 tablets by mouth at bedtime.      Results for orders placed or performed during the hospital encounter of 04/08/20 (from the past 48 hour(s))  Glucose, capillary     Status: Abnormal   Collection Time: 04/08/20  7:12 AM  Result Value Ref Range   Glucose-Capillary 118 (H) 70 - 99 mg/dL    Comment: Glucose reference range applies only to samples taken after fasting for at least 8 hours.   No results found.  Review of Systems  Constitutional: Negative.   HENT: Negative.   Eyes: Negative.   Respiratory: Negative.   Cardiovascular: Negative.   Gastrointestinal: Negative.   Endocrine: Negative.   Genitourinary: Negative.   Musculoskeletal: Negative.   Skin: Negative.   Allergic/Immunologic: Negative.   Neurological: Negative.   Hematological: Negative.   Psychiatric/Behavioral: Negative.     Blood pressure (!) 147/88, temperature 98.1 F (36.7 C), temperature source Oral, resp. rate  19, height 5\' 5"  (1.651 m), weight 68 kg, SpO2 99 %. Physical Exam  GENERAL: The patient is AO x3, in no acute distress. HEENT: Head is normocephalic and atraumatic. EOMI are intact. Mouth is well hydrated and without lesions. NECK: Supple. No masses LUNGS: Clear to auscultation. No presence of rhonchi/wheezing/rales. Adequate chest expansion HEART: RRR, normal s1 and s2. ABDOMEN: Soft, nontender, no guarding, no peritoneal signs, and nondistended. BS +. No masses. EXTREMITIES: Without any cyanosis, clubbing, rash, lesions or edema. NEUROLOGIC: AOx3, no focal motor deficit. SKIN: no jaundice, no rashes  Assessment/Plan 64 y/o F with PMH diabetes, hyperlipidemia, hypothyroidism, coming to the hospital for endoscopic evaluation of history of colon polyps.  We will proceed with colonoscopy today.  Harvel Quale, MD 04/08/2020, 8:48 AM

## 2020-04-08 NOTE — Transfer of Care (Signed)
Immediate Anesthesia Transfer of Care Note  Patient: Krista Lambert  Procedure(s) Performed: POLYPECTOMY FLEXIBLE SIGMOIDOSCOPY  Patient Location: Endoscopy Unit  Anesthesia Type:MAC  Level of Consciousness: awake, alert , oriented and patient cooperative  Airway & Oxygen Therapy: Patient Spontanous Breathing  Post-op Assessment: Report given to RN and Post -op Vital signs reviewed and stable  Post vital signs: Reviewed and stable  Last Vitals:  Vitals Value Taken Time  BP    Temp    Pulse    Resp    SpO2      Last Pain:  Vitals:   04/08/20 0903  TempSrc:   PainSc: 0-No pain      Patients Stated Pain Goal: 5 (29/57/47 [id number])  Complications: No complications documented.

## 2020-04-08 NOTE — Anesthesia Preprocedure Evaluation (Addendum)
Anesthesia Evaluation  Patient identified by MRN, date of birth, ID band Patient awake    Reviewed: Allergy & Precautions, NPO status , Patient's Chart, lab work & pertinent test results  History of Anesthesia Complications Negative for: history of anesthetic complications  Airway Mallampati: II  TM Distance: >3 FB Neck ROM: Full    Dental  (+) Dental Advisory Given, Teeth Intact Crowns :   Pulmonary neg pulmonary ROS,    Pulmonary exam normal breath sounds clear to auscultation       Cardiovascular Exercise Tolerance: Good Normal cardiovascular exam Rhythm:Regular Rate:Normal     Neuro/Psych negative neurological ROS  negative psych ROS   GI/Hepatic negative GI ROS, Neg liver ROS,   Endo/Other  diabetes, Well Controlled, Type 2, Oral Hypoglycemic AgentsHypothyroidism   Renal/GU      Musculoskeletal negative musculoskeletal ROS (+)   Abdominal   Peds  Hematology   Anesthesia Other Findings   Reproductive/Obstetrics negative OB ROS                             Anesthesia Physical  Anesthesia Plan  ASA: II  Anesthesia Plan: General   Post-op Pain Management:    Induction: Intravenous  PONV Risk Score and Plan: TIVA  Airway Management Planned: Nasal Cannula, Natural Airway and Simple Face Mask  Additional Equipment:   Intra-op Plan:   Post-operative Plan:   Informed Consent: I have reviewed the patients History and Physical, chart, labs and discussed the procedure including the risks, benefits and alternatives for the proposed anesthesia with the patient or authorized representative who has indicated his/her understanding and acceptance.     Dental advisory given  Plan Discussed with: CRNA and Surgeon  Anesthesia Plan Comments:         Anesthesia Quick Evaluation  

## 2020-04-08 NOTE — Anesthesia Postprocedure Evaluation (Signed)
Anesthesia Post Note  Patient: Krista Lambert  Procedure(s) Performed: POLYPECTOMY FLEXIBLE SIGMOIDOSCOPY  Patient location during evaluation: Endoscopy Anesthesia Type: General and MAC Level of consciousness: awake and alert Pain management: pain level controlled Vital Signs Assessment: post-procedure vital signs reviewed and stable Respiratory status: spontaneous breathing Cardiovascular status: stable Postop Assessment: no apparent nausea or vomiting Anesthetic complications: no   No complications documented.   Last Vitals:  Vitals:   04/08/20 0931 04/08/20 0937  BP: 115/78 122/85  Pulse: 83   Resp: 20   Temp: 36.4 C   SpO2: 97% 99%    Last Pain:  Vitals:   04/08/20 0931  TempSrc: Oral  PainSc: 0-No pain                 Everette Rank

## 2020-04-09 ENCOUNTER — Encounter (HOSPITAL_COMMUNITY)
Admission: RE | Disposition: A | Discharge status: Home / Self Care | Payer: Self-pay | Source: Home / Self Care | Attending: Gastroenterology

## 2020-04-09 ENCOUNTER — Ambulatory Visit (HOSPITAL_COMMUNITY): Payer: BC Managed Care – PPO | Admitting: Anesthesiology

## 2020-04-09 ENCOUNTER — Ambulatory Visit (HOSPITAL_COMMUNITY)
Admission: RE | Admit: 2020-04-09 | Discharge: 2020-04-09 | Disposition: A | Discharge status: Home / Self Care | Payer: BC Managed Care – PPO | Attending: Gastroenterology | Admitting: Gastroenterology

## 2020-04-09 ENCOUNTER — Encounter (HOSPITAL_COMMUNITY): Payer: Self-pay | Admitting: Gastroenterology

## 2020-04-09 ENCOUNTER — Other Ambulatory Visit: Payer: Self-pay

## 2020-04-09 DIAGNOSIS — Z7984 Long term (current) use of oral hypoglycemic drugs: Secondary | ICD-10-CM | POA: Diagnosis not present

## 2020-04-09 DIAGNOSIS — Z1211 Encounter for screening for malignant neoplasm of colon: Secondary | ICD-10-CM | POA: Diagnosis present

## 2020-04-09 DIAGNOSIS — K5730 Diverticulosis of large intestine without perforation or abscess without bleeding: Secondary | ICD-10-CM | POA: Diagnosis not present

## 2020-04-09 DIAGNOSIS — E119 Type 2 diabetes mellitus without complications: Secondary | ICD-10-CM | POA: Insufficient documentation

## 2020-04-09 DIAGNOSIS — D120 Benign neoplasm of cecum: Secondary | ICD-10-CM | POA: Diagnosis not present

## 2020-04-09 DIAGNOSIS — Z8719 Personal history of other diseases of the digestive system: Secondary | ICD-10-CM | POA: Diagnosis not present

## 2020-04-09 DIAGNOSIS — Z79890 Hormone replacement therapy (postmenopausal): Secondary | ICD-10-CM | POA: Insufficient documentation

## 2020-04-09 DIAGNOSIS — E039 Hypothyroidism, unspecified: Secondary | ICD-10-CM | POA: Diagnosis not present

## 2020-04-09 DIAGNOSIS — D123 Benign neoplasm of transverse colon: Secondary | ICD-10-CM

## 2020-04-09 DIAGNOSIS — E785 Hyperlipidemia, unspecified: Secondary | ICD-10-CM | POA: Insufficient documentation

## 2020-04-09 DIAGNOSIS — Z7982 Long term (current) use of aspirin: Secondary | ICD-10-CM | POA: Insufficient documentation

## 2020-04-09 DIAGNOSIS — Z79899 Other long term (current) drug therapy: Secondary | ICD-10-CM | POA: Insufficient documentation

## 2020-04-09 DIAGNOSIS — Z86010 Personal history of colonic polyps: Secondary | ICD-10-CM

## 2020-04-09 DIAGNOSIS — K648 Other hemorrhoids: Secondary | ICD-10-CM

## 2020-04-09 HISTORY — PX: COLONOSCOPY WITH PROPOFOL: SHX5780

## 2020-04-09 HISTORY — PX: POLYPECTOMY: SHX5525

## 2020-04-09 LAB — GLUCOSE, CAPILLARY: Glucose-Capillary: 102 mg/dL — ABNORMAL HIGH (ref 70–99)

## 2020-04-09 LAB — SURGICAL PATHOLOGY

## 2020-04-09 SURGERY — COLONOSCOPY WITH PROPOFOL
Anesthesia: General

## 2020-04-09 MED ORDER — LACTATED RINGERS IV SOLN: INTRAVENOUS | Status: DC | PRN

## 2020-04-09 MED ORDER — PROPOFOL 500 MG/50ML IV EMUL
INTRAVENOUS | Status: DC | PRN
  Administered 2020-04-09: 150 ug/kg/min via INTRAVENOUS

## 2020-04-09 MED ORDER — SODIUM CHLORIDE 0.9 % IV SOLN: INTRAVENOUS | Status: DC

## 2020-04-09 MED ORDER — STERILE WATER FOR IRRIGATION IR SOLN
Status: DC | PRN
  Administered 2020-04-09: 100 mL

## 2020-04-09 MED ORDER — PROPOFOL 10 MG/ML IV BOLUS
INTRAVENOUS | Status: AC
  Filled 2020-04-09: qty 80

## 2020-04-09 MED ORDER — PROPOFOL 10 MG/ML IV BOLUS
INTRAVENOUS | Status: DC | PRN
  Administered 2020-04-09 (×2): 50 mg via INTRAVENOUS

## 2020-04-09 MED ORDER — LACTATED RINGERS IV SOLN: Freq: Once | INTRAVENOUS | Status: AC

## 2020-04-09 NOTE — Transfer of Care (Signed)
Immediate Anesthesia Transfer of Care Note  Patient: Krista Lambert  Procedure(s) Performed: COLONOSCOPY WITH PROPOFOL (N/A ) POLYPECTOMY  Patient Location: PACU  Anesthesia Type:General  Level of Consciousness: awake, alert , oriented and patient cooperative  Airway & Oxygen Therapy: Patient Spontanous Breathing  Post-op Assessment: Report given to RN, Post -op Vital signs reviewed and stable and Patient moving all extremities X 4  Post vital signs: Reviewed and stable  Last Vitals:  Vitals Value Taken Time  BP    Temp    Pulse    Resp    SpO2      Last Pain:  Vitals:   04/09/20 0737  TempSrc:   PainSc: 0-No pain         Complications: No complications documented.

## 2020-04-09 NOTE — Anesthesia Postprocedure Evaluation (Signed)
Anesthesia Post Note  Patient: Krista Lambert  Procedure(s) Performed: COLONOSCOPY WITH PROPOFOL (N/A ) POLYPECTOMY  Patient location during evaluation: Phase II Anesthesia Type: General Level of consciousness: awake and alert, oriented and patient cooperative Pain management: satisfactory to patient Vital Signs Assessment: post-procedure vital signs reviewed and stable Respiratory status: spontaneous breathing, nonlabored ventilation and respiratory function stable Cardiovascular status: stable Postop Assessment: no apparent nausea or vomiting Anesthetic complications: no   No complications documented.   Last Vitals:  Vitals:   04/09/20 0708  BP: (!) 142/82  Pulse: 90  Resp: 11  Temp: 36.7 C  SpO2: 98%    Last Pain:  Vitals:   04/09/20 0737  TempSrc:   PainSc: 0-No pain                 Race Latour

## 2020-04-09 NOTE — Interval H&P Note (Signed)
History and Physical Interval Note:  04/09/2020 7:35 AM  64 y/o F with PMH diabetes, hyperlipidemia, hypothyroidism, coming to the hospital for endoscopic evaluation of history of colon polyps.  Has had more recent colonoscopy in 2013, was found to have one rectal polyp.  She was close to undergo a colonoscopy yesterday but had inadequate prep.  Patient will be scheduled to undergo the procedure today.  BP (!) 142/82   Pulse 90   Temp 98.1 F (36.7 C) (Oral)   Resp 11   Ht 5\' 5"  (1.651 m)   SpO2 98%   BMI 24.96 kg/m  GENERAL: The patient is AO x3, in no acute distress. HEENT: Head is normocephalic and atraumatic. EOMI are intact. Mouth is well hydrated and without lesions. NECK: Supple. No masses LUNGS: Clear to auscultation. No presence of rhonchi/wheezing/rales. Adequate chest expansion HEART: RRR, normal s1 and s2. ABDOMEN: Soft, nontender, no guarding, no peritoneal signs, and nondistended. BS +. No masses. EXTREMITIES: Without any cyanosis, clubbing, rash, lesions or edema. NEUROLOGIC: AOx3, no focal motor deficit. SKIN: no jaundice, no rashes   Krista Lambert  has presented today for surgery, with the diagnosis of history of polyps.  The various methods of treatment have been discussed with the patient and family. After consideration of risks, benefits and other options for treatment, the patient has consented to  Procedure(s): COLONOSCOPY WITH PROPOFOL (N/A) as a surgical intervention.  The patient's history has been reviewed, patient examined, no change in status, stable for surgery.  I have reviewed the patient's chart and labs.  Questions were answered to the patient's satisfaction.     [name]

## 2020-04-09 NOTE — Anesthesia Preprocedure Evaluation (Signed)
Anesthesia Evaluation  Patient identified by MRN, date of birth, ID band Patient awake    Reviewed: Allergy & Precautions, NPO status , Patient's Chart, lab work & pertinent test results  History of Anesthesia Complications Negative for: history of anesthetic complications  Airway Mallampati: II  TM Distance: >3 FB Neck ROM: Full    Dental  (+) Dental Advisory Given, Teeth Intact Crowns :   Pulmonary neg pulmonary ROS,    Pulmonary exam normal breath sounds clear to auscultation       Cardiovascular Exercise Tolerance: Good Normal cardiovascular exam Rhythm:Regular Rate:Normal     Neuro/Psych negative neurological ROS  negative psych ROS   GI/Hepatic negative GI ROS, Neg liver ROS,   Endo/Other  diabetes, Well Controlled, Type 2, Oral Hypoglycemic AgentsHypothyroidism   Renal/GU      Musculoskeletal negative musculoskeletal ROS (+)   Abdominal   Peds  Hematology   Anesthesia Other Findings   Reproductive/Obstetrics negative OB ROS                             Anesthesia Physical  Anesthesia Plan  ASA: II  Anesthesia Plan: General   Post-op Pain Management:    Induction: Intravenous  PONV Risk Score and Plan: TIVA  Airway Management Planned: Nasal Cannula, Natural Airway and Simple Face Mask  Additional Equipment:   Intra-op Plan:   Post-operative Plan:   Informed Consent: I have reviewed the patients History and Physical, chart, labs and discussed the procedure including the risks, benefits and alternatives for the proposed anesthesia with the patient or authorized representative who has indicated his/her understanding and acceptance.     Dental advisory given  Plan Discussed with: CRNA and Surgeon  Anesthesia Plan Comments:         Anesthesia Quick Evaluation

## 2020-04-09 NOTE — Discharge Instructions (Addendum)
You are being discharged to home.  Eat a high fiber diet.  We are waiting for your pathology results.  Your physician has recommended a repeat colonoscopy in six months for surveillance.   Diverticulosis  Diverticulosis is a condition that develops when small pouches (diverticula) form in the wall of the large intestine (colon). The colon is where water is absorbed and stool (feces) is formed. The pouches form when the inside layer of the colon pushes through weak spots in the outer layers of the colon. You may have a few pouches or many of them. The pouches usually do not cause problems unless they become inflamed or infected. When this happens, the condition is called diverticulitis. What are the causes? The cause of this condition is not known. What increases the risk? The following factors may make you more likely to develop this condition:  Being older than age 70. Your risk for this condition increases with age. Diverticulosis is rare among people younger than age 75. By age 2, many people have it.  Eating a low-fiber diet.  Having frequent constipation.  Being overweight.  Not getting enough exercise.  Smoking.  Taking over-the-counter pain medicines, like aspirin and ibuprofen.  Having a family history of diverticulosis. What are the signs or symptoms? In most people, there are no symptoms of this condition. If you do have symptoms, they may include:  Bloating.  Cramps in the abdomen.  Constipation or diarrhea.  Pain in the lower left side of the abdomen. How is this diagnosed? Because diverticulosis usually has no symptoms, it is most often diagnosed during an exam for other colon problems. The condition may be diagnosed by:  Using a flexible scope to examine the colon (colonoscopy).  Taking an X-ray of the colon after dye has been put into the colon (barium enema).  Having a CT scan. How is this treated? You may not need treatment for this condition. Your  health care provider may recommend treatment to prevent problems. You may need treatment if you have symptoms or if you previously had diverticulitis. Treatment may include:  Eating a high-fiber diet.  Taking a fiber supplement.  Taking a live bacteria supplement (probiotic).  Taking medicine to relax your colon. Follow these instructions at home: Medicines  Take over-the-counter and prescription medicines only as told by your health care provider.  If told by your health care provider, take a fiber supplement or probiotic. Constipation prevention Your condition may cause constipation. To prevent or treat constipation, you may need to:  Drink enough fluid to keep your urine pale yellow.  Take over-the-counter or prescription medicines.  Eat foods that are high in fiber, such as beans, whole grains, and fresh fruits and vegetables.  Limit foods that are high in fat and processed sugars, such as fried or sweet foods.  General instructions  Try not to strain when you have a bowel movement.  Keep all follow-up visits as told by your health care provider. This is important. Contact a health care provider if you:  Have pain in your abdomen.  Have bloating.  Have cramps.  Have not had a bowel movement in 3 days. Get help right away if:  Your pain gets worse.  Your bloating becomes very bad.  You have a fever or chills, and your symptoms suddenly get worse.  You vomit.  You have bowel movements that are bloody or black.  You have bleeding from your rectum. Summary  Diverticulosis is a condition that develops when small  pouches (diverticula) form in the wall of the large intestine (colon).  You may have a few pouches or many of them.  This condition is most often diagnosed during an exam for other colon problems.  Treatment may include increasing the fiber in your diet, taking supplements, or taking medicines. This information is not intended to replace advice  given to you by your health care provider. Make sure you discuss any questions you have with your health care provider. Document Revised: 01/11/2019 Document Reviewed: 01/11/2019 Elsevier Patient Education  [place]. Colonoscopy, Adult, Care After This sheet gives you information about how to care for yourself after your procedure. Your health care provider may also give you more specific instructions. If you have problems or questions, contact your health care provider. What can I expect after the procedure? After the procedure, it is common to have:  A small amount of blood in your stool for 24 hours after the procedure.  Some gas.  Mild cramping or bloating of your abdomen. Follow these instructions at home: Eating and drinking   Drink enough fluid to keep your urine pale yellow.  Follow instructions from your health care provider about eating or drinking restrictions.  Resume your normal diet as instructed by your health care provider. Avoid heavy or fried foods that are hard to digest. Activity  Rest as told by your health care provider.  Avoid sitting for a long time without moving. Get up to take short walks every 1-2 hours. This is important to improve blood flow and breathing. Ask for help if you feel weak or unsteady.  Return to your normal activities as told by your health care provider. Ask your health care provider what activities are safe for you. Managing cramping and bloating   Try walking around when you have cramps or feel bloated.  Apply heat to your abdomen as told by your health care provider. Use the heat source that your health care provider recommends, such as a moist heat pack or a heating pad. ? Place a towel between your skin and the heat source. ? Leave the heat on for 20-30 minutes. ? Remove the heat if your skin turns bright red. This is especially important if you are unable to feel pain, heat, or cold. You may have a greater risk of  getting burned. General instructions  For the first 24 hours after the procedure: ? Do not drive or use machinery. ? Do not sign important documents. ? Do not drink alcohol. ? Do your regular daily activities at a slower pace than normal. ? Eat soft foods that are easy to digest.  Take over-the-counter and prescription medicines only as told by your health care provider.  Keep all follow-up visits as told by your health care provider. This is important. Contact a health care provider if:  You have blood in your stool 2-3 days after the procedure. Get help right away if you have:  More than a small spotting of blood in your stool.  Large blood clots in your stool.  Swelling of your abdomen.  Nausea or vomiting.  A fever.  Increasing pain in your abdomen that is not relieved with medicine. Summary  After the procedure, it is common to have a small amount of blood in your stool. You may also have mild cramping and bloating of your abdomen.  For the first 24 hours after the procedure, do not drive or use machinery, sign important documents, or drink alcohol.  Get help  right away if you have a lot of blood in your stool, nausea or vomiting, a fever, or increased pain in your abdomen. This information is not intended to replace advice given to you by your health care provider. Make sure you discuss any questions you have with your health care provider. Document Revised: 01/08/2019 Document Reviewed: 01/08/2019 Elsevier Patient Education  [place]. Colon Polyps  Polyps are tissue growths inside the body. Polyps can grow in many places, including the large intestine (colon). A polyp may be a round bump or a mushroom-shaped growth. You could have one polyp or several. Most colon polyps are noncancerous (benign). However, some colon polyps can become cancerous over time. Finding and removing the polyps early can help prevent this. What are the causes? The exact cause of  colon polyps is not known. What increases the risk? You are more likely to develop this condition if you:  Have a family history of colon cancer or colon polyps.  Are older than 34 or older than 45 if you are African American.  Have inflammatory bowel disease, such as ulcerative colitis or Crohn's disease.  Have certain hereditary conditions, such as: ? Familial adenomatous polyposis. ? Lynch syndrome. ? Turcot syndrome. ? Peutz-Jeghers syndrome.  Are overweight.  Smoke cigarettes.  Do not get enough exercise.  Drink too much alcohol.  Eat a diet that is high in fat and red meat and low in fiber.  Had childhood cancer that was treated with abdominal radiation. What are the signs or symptoms? Most polyps do not cause symptoms. If you have symptoms, they may include:  Blood coming from your rectum when having a bowel movement.  Blood in your stool. The stool may look dark red or black.  Abdominal pain.  A change in bowel habits, such as constipation or diarrhea. How is this diagnosed? This condition is diagnosed with a colonoscopy. This is a procedure in which a lighted, flexible scope is inserted into the anus and then passed into the colon to examine the area. Polyps are sometimes found when a colonoscopy is done as part of routine cancer screening tests. How is this treated? Treatment for this condition involves removing any polyps that are found. Most polyps can be removed during a colonoscopy. Those polyps will then be tested for cancer. Additional treatment may be needed depending on the results of testing. Follow these instructions at home: Lifestyle  Maintain a healthy weight, or lose weight if recommended by your health care provider.  Exercise every day or as told by your health care provider.  Do not use any products that contain nicotine or tobacco, such as cigarettes and e-cigarettes. If you need help quitting, ask your health care provider.  If you drink  alcohol, limit how much you have: ? 0-1 drink a day for women. ? 0-2 drinks a day for men.  Be aware of how much alcohol is in your drink. In the [place] one drink equals one 12 oz bottle of beer (355 mL), one 5 oz glass of wine (148 mL), or one 1 oz shot of hard liquor (44 mL). Eating and drinking   Eat foods that are high in fiber, such as fruits, vegetables, and whole grains.  Eat foods that are high in calcium and vitamin D, such as milk, cheese, yogurt, eggs, liver, fish, and broccoli.  Limit foods that are high in fat, such as fried foods and desserts.  Limit the amount of red meat and processed meat you  eat, such as hot dogs, sausage, bacon, and lunch meats. General instructions  Keep all follow-up visits as told by your health care provider. This is important. ? This includes having regularly scheduled colonoscopies. ? Talk to your health care provider about when you need a colonoscopy. Contact a health care provider if:  You have new or worsening bleeding during a bowel movement.  You have new or increased blood in your stool.  You have a change in bowel habits.  You lose weight for no known reason. Summary  Polyps are tissue growths inside the body. Polyps can grow in many places, including the colon.  Most colon polyps are noncancerous (benign), but some can become cancerous over time.  This condition is diagnosed with a colonoscopy.  Treatment for this condition involves removing any polyps that are found. Most polyps can be removed during a colonoscopy. This information is not intended to replace advice given to you by your health care provider. Make sure you discuss any questions you have with your health care provider. Document Revised: 09/29/2017 Document Reviewed: 09/29/2017 Elsevier Patient Education  [place].

## 2020-04-09 NOTE — Op Note (Signed)
[facility] Patient Name: Krista Lambert Procedure Date: 04/09/2020 7:42 AM MRN: [id number] Date of Birth: [birth year] Attending MD: Maylon Peppers ,  CSN: [id number] Age: 64 Admit Type: Outpatient Procedure:                Colonoscopy Indications:              High risk colon cancer surveillance: Personal                            history of colonic polyps Providers:                Maylon Peppers, Lambert Mody, Lurline Del,                            RN, Raphael Gibney, Technician Referring MD:              Medicines:                Monitored Anesthesia Care Complications:            No immediate complications. Estimated Blood Loss:     Estimated blood loss: none. Procedure:                Pre-Anesthesia Assessment:                           - Prior to the procedure, a History and Physical                            was performed, and patient medications, allergies                            and sensitivities were reviewed. The patient's                            tolerance of previous anesthesia was reviewed.                           - The risks and benefits of the procedure and the                            sedation options and risks were discussed with the                            patient. All questions were answered and informed                            consent was obtained.                           - ASA Grade Assessment: II - A patient with mild                            systemic disease.                           After obtaining informed consent, the colonoscope  was passed under direct vision. Throughout the                            procedure, the patient's blood pressure, pulse, and                            oxygen saturations were monitored continuously. The                            [id number]-[id number] ([id number]) scope was introduced through                            the anus and advanced to the the cecum, identified                             by appendiceal orifice and ileocecal valve. The                            colonoscopy was performed without difficulty. The                            patient tolerated the procedure well. Scope                            withdrawal time was 15 minutes. The quality of the                            bowel preparation was good. Scope In: 7:42:47 AM Scope Out: 8:44:41 AM Scope Withdrawal Time: 0 hours 41 minutes 47 seconds  Total Procedure Duration: 1 hour 1 minute 54 seconds  Findings:      The perianal and digital rectal examinations were normal.      Two sessile polyps were found in the ileocecal valve. The polyps were 5       to 8 mm in size. These polyps were removed with a cold snare. One of the       polyps (largest) was removed in a piecemeal fashion with the aid of a       cold forceps. Resection and retrieval were complete. For hemostasis, one       hemostatic clip was successfully placed. There was no bleeding at the       end of the procedure.      Two sessile polyps were found in the transverse colon. The polyps were 4       to 6 mm in size. These polyps were removed with a cold snare. Resection       and retrieval were complete.      Multiple small and large-mouthed diverticula were found in the sigmoid       colon, descending colon and ascending colon.      Non-bleeding internal hemorrhoids were found during retroflexion. The       hemorrhoids were medium-sized. Impression:               - Two 5 to 8 mm polyps at the ileocecal valve,  removed with a cold snare, one removed piecemeal.                            Resected and retrieved. Clip was placed.                           - Two 4 to 6 mm polyps in the transverse colon,                            removed with a cold snare. Resected and retrieved.                           - Diverticulosis in the sigmoid colon, in the                            descending colon and in the ascending  colon.                           - Non-bleeding internal hemorrhoids. Moderate Sedation:      Per Anesthesia Care Recommendation:           - Discharge patient to home (ambulatory).                           - High fiber diet.                           - Await pathology results.                           - Repeat colonoscopy in 6 months for surveillance. Procedure Code(s):        --- Professional ---                           [phone], GC, Colonoscopy, flexible; with removal of                            tumor(s), polyp(s), or other lesion(s) by snare                            technique Diagnosis Code(s):        --- Professional ---                           Z86.010, Personal history of colonic polyps                           K63.5, Polyp of colon                           K64.8, Other hemorrhoids                           K57.30, Diverticulosis of large intestine without  perforation or abscess without bleeding CPT copyright 2019 American Medical Association. All rights reserved. The codes documented in this report are preliminary and upon coder review may  be revised to meet current compliance requirements. Maylon Peppers, MD Maylon Peppers,  04/09/2020 8:53:03 AM This report has been signed electronically. Number of Addenda: 0

## 2020-04-10 LAB — SURGICAL PATHOLOGY

## 2020-04-11 ENCOUNTER — Encounter (HOSPITAL_COMMUNITY): Payer: Self-pay | Admitting: Gastroenterology

## 2020-04-14 ENCOUNTER — Encounter (HOSPITAL_COMMUNITY): Payer: Self-pay | Admitting: Gastroenterology

## 2020-06-12 ENCOUNTER — Ambulatory Visit: Payer: BC Managed Care – PPO | Attending: Internal Medicine

## 2020-06-12 DIAGNOSIS — Z23 Encounter for immunization: Secondary | ICD-10-CM

## 2020-06-12 NOTE — Progress Notes (Signed)
   Covid-19 Vaccination Clinic  Name:  Krista Lambert    MRN: [id number] DOB: Apr 16, 1956  06/12/2020  [name] was observed post Covid-19 immunization for 15 minutes without incident. She was provided with Vaccine Information Sheet and instruction to access the V-Safe system.   [name] was instructed to call 911 with any severe reactions post vaccine: Marland Kitchen Difficulty breathing  . Swelling of face and throat  . A fast heartbeat  . A bad rash all over body  . Dizziness and weakness   Immunizations Administered    No immunizations on file.

## 2020-09-23 ENCOUNTER — Encounter (INDEPENDENT_AMBULATORY_CARE_PROVIDER_SITE_OTHER): Payer: Self-pay | Admitting: *Deleted

## 2020-12-22 ENCOUNTER — Telehealth (INDEPENDENT_AMBULATORY_CARE_PROVIDER_SITE_OTHER): Payer: Self-pay

## 2020-12-22 ENCOUNTER — Other Ambulatory Visit (INDEPENDENT_AMBULATORY_CARE_PROVIDER_SITE_OTHER): Payer: Self-pay

## 2020-12-22 DIAGNOSIS — Z86010 Personal history of colonic polyps: Secondary | ICD-10-CM

## 2020-12-22 NOTE — Telephone Encounter (Signed)
Ok to schedule.  Thanks,  Caedin Mogan Castaneda Mayorga, MD Gastroenterology and Hepatology [facility]  

## 2020-12-22 NOTE — Telephone Encounter (Signed)
Referring MD/PCP: Huel Cote  Procedure: Screening  Reason/Indication:  Hx of polyps  Has patient had this procedure before?  Yes   If so, when, by whom and where?  03/2020  Is there a family history of colon cancer?  no  Who?  What age when diagnosed?    Is patient diabetic? If yes, Type 1 or Type 2   yes type 2      Does patient have prosthetic heart valve or mechanical valve?  no  Do you have a pacemaker/defibrillator?  no  Has patient ever had endocarditis/atrial fibrillation? no  Does patient use oxygen? no  Has patient had joint replacement within last 12 months?  no  Is patient constipated or do they take laxatives? Yes, constipation  Does patient have a history of alcohol/drug use?  no  Have you had a stroke/heart attack last 6 mths? no  Do you take medicine for weight loss?  no  For female patients,: do you still have your menstrual cycle? N/A  Is patient on blood thinner such as Coumadin, Plavix and/or Aspirin? yes  Medications: Metformin 500 mg bid, Synthroid 75 mcg daily, Crestor 20 mg daily, Vit c 1000 mg bid, Vit D3 125 mcg, Mvi daily, CoQ 10 200 mg daily, vit B12 5000 mcg daily, asa 81 mg daily, Ibuprofen 200 mg bid, Colace 100 mg 2-3 tabs daily, peri-colace 2-3 tabs daily, [facility] colon health daily, Metamucil gummies 2-3 daily, allegra 180 mg daily, flonase daily  Allergies: Miralax  Medication Adjustment per Dr Jenetta Downer Hold metformin the evening prior and morning of procedure  Procedure date & time: 01/21/21 10;00

## 2020-12-23 ENCOUNTER — Telehealth (INDEPENDENT_AMBULATORY_CARE_PROVIDER_SITE_OTHER): Payer: Self-pay

## 2020-12-23 ENCOUNTER — Encounter (INDEPENDENT_AMBULATORY_CARE_PROVIDER_SITE_OTHER): Payer: Self-pay

## 2020-12-23 MED ORDER — SUPREP BOWEL PREP KIT 17.5-3.13-1.6 GM/177ML PO SOLN: 1.0000 | ORAL | 0 refills | Status: DC

## 2020-12-23 NOTE — Telephone Encounter (Signed)
Krista Lambert, CMA  

## 2020-12-30 ENCOUNTER — Telehealth (INDEPENDENT_AMBULATORY_CARE_PROVIDER_SITE_OTHER): Payer: Self-pay

## 2020-12-30 NOTE — Telephone Encounter (Signed)
LeighAnn Jiselle Sheu, CMA  

## 2021-01-21 ENCOUNTER — Ambulatory Visit (HOSPITAL_COMMUNITY)
Admission: RE | Admit: 2021-01-21 | Discharge: 2021-01-21 | Disposition: A | Discharge status: Home / Self Care | Payer: BC Managed Care – PPO | Attending: Gastroenterology | Admitting: Gastroenterology

## 2021-01-21 ENCOUNTER — Ambulatory Visit (HOSPITAL_COMMUNITY): Payer: BC Managed Care – PPO | Admitting: Anesthesiology

## 2021-01-21 ENCOUNTER — Other Ambulatory Visit: Payer: Self-pay

## 2021-01-21 ENCOUNTER — Encounter (HOSPITAL_COMMUNITY)
Admission: RE | Disposition: A | Discharge status: Home / Self Care | Payer: Self-pay | Source: Home / Self Care | Attending: Gastroenterology

## 2021-01-21 ENCOUNTER — Encounter (HOSPITAL_COMMUNITY): Payer: Self-pay | Admitting: Gastroenterology

## 2021-01-21 DIAGNOSIS — Z7984 Long term (current) use of oral hypoglycemic drugs: Secondary | ICD-10-CM | POA: Insufficient documentation

## 2021-01-21 DIAGNOSIS — E785 Hyperlipidemia, unspecified: Secondary | ICD-10-CM | POA: Diagnosis not present

## 2021-01-21 DIAGNOSIS — E039 Hypothyroidism, unspecified: Secondary | ICD-10-CM | POA: Diagnosis not present

## 2021-01-21 DIAGNOSIS — Z1211 Encounter for screening for malignant neoplasm of colon: Secondary | ICD-10-CM | POA: Diagnosis not present

## 2021-01-21 DIAGNOSIS — K648 Other hemorrhoids: Secondary | ICD-10-CM | POA: Diagnosis not present

## 2021-01-21 DIAGNOSIS — Z86010 Personal history of colonic polyps: Secondary | ICD-10-CM

## 2021-01-21 DIAGNOSIS — Z8719 Personal history of other diseases of the digestive system: Secondary | ICD-10-CM | POA: Diagnosis not present

## 2021-01-21 DIAGNOSIS — Z888 Allergy status to other drugs, medicaments and biological substances status: Secondary | ICD-10-CM | POA: Insufficient documentation

## 2021-01-21 DIAGNOSIS — K635 Polyp of colon: Secondary | ICD-10-CM | POA: Diagnosis not present

## 2021-01-21 DIAGNOSIS — Z79899 Other long term (current) drug therapy: Secondary | ICD-10-CM | POA: Insufficient documentation

## 2021-01-21 DIAGNOSIS — D122 Benign neoplasm of ascending colon: Secondary | ICD-10-CM | POA: Insufficient documentation

## 2021-01-21 DIAGNOSIS — K5730 Diverticulosis of large intestine without perforation or abscess without bleeding: Secondary | ICD-10-CM | POA: Diagnosis not present

## 2021-01-21 DIAGNOSIS — E119 Type 2 diabetes mellitus without complications: Secondary | ICD-10-CM | POA: Diagnosis not present

## 2021-01-21 DIAGNOSIS — Z7982 Long term (current) use of aspirin: Secondary | ICD-10-CM | POA: Insufficient documentation

## 2021-01-21 DIAGNOSIS — Q438 Other specified congenital malformations of intestine: Secondary | ICD-10-CM

## 2021-01-21 DIAGNOSIS — D125 Benign neoplasm of sigmoid colon: Secondary | ICD-10-CM

## 2021-01-21 HISTORY — PX: COLONOSCOPY WITH PROPOFOL: SHX5780

## 2021-01-21 HISTORY — PX: POLYPECTOMY: SHX149

## 2021-01-21 LAB — HM COLONOSCOPY

## 2021-01-21 LAB — GLUCOSE, CAPILLARY: Glucose-Capillary: 110 mg/dL — ABNORMAL HIGH (ref 70–99)

## 2021-01-21 SURGERY — COLONOSCOPY WITH PROPOFOL
Anesthesia: General

## 2021-01-21 MED ORDER — STERILE WATER FOR IRRIGATION IR SOLN
Status: DC | PRN
  Administered 2021-01-21: 200 mL

## 2021-01-21 MED ORDER — LACTATED RINGERS IV SOLN: INTRAVENOUS | Status: DC

## 2021-01-21 MED ORDER — PROPOFOL 10 MG/ML IV BOLUS
INTRAVENOUS | Status: AC
  Filled 2021-01-21: qty 80

## 2021-01-21 MED ORDER — PROPOFOL 500 MG/50ML IV EMUL
INTRAVENOUS | Status: DC | PRN
  Administered 2021-01-21: 150 ug/kg/min via INTRAVENOUS

## 2021-01-21 MED ORDER — LIDOCAINE HCL 1 % IJ SOLN
INTRAMUSCULAR | Status: DC | PRN
Start: 2021-01-21 — End: 2021-01-21
  Administered 2021-01-21: 50 mg via INTRADERMAL

## 2021-01-21 MED ORDER — PROPOFOL 10 MG/ML IV BOLUS
INTRAVENOUS | Status: DC | PRN
Start: 2021-01-21 — End: 2021-01-21
  Administered 2021-01-21: 30 mg via INTRAVENOUS
  Administered 2021-01-21: 50 mg via INTRAVENOUS
  Administered 2021-01-21: 20 mg via INTRAVENOUS

## 2021-01-21 MED ORDER — LIDOCAINE HCL (PF) 2 % IJ SOLN
INTRAMUSCULAR | Status: AC
  Filled 2021-01-21: qty 5

## 2021-01-21 NOTE — Op Note (Signed)
[facility] Patient Name: Krista Lambert Procedure Date: 01/21/2021 9:56 AM MRN: [id number] Date of Birth: 09-06-1955 Attending MD: Maylon Peppers ,  CSN: [id number] Age: 65 Admit Type: Outpatient Procedure:                Colonoscopy Indications:              High risk colon cancer surveillance: Personal                            history of non-advanced adenoma with piecemeal                            resection Providers:                Maylon Peppers, Crystal Page, Aram Candela Referring MD:              Medicines:                Monitored Anesthesia Care Complications:            No immediate complications. Estimated Blood Loss:     Estimated blood loss: none. Procedure:                Pre-Anesthesia Assessment:                           - Prior to the procedure, a History and Physical                            was performed, and patient medications, allergies                            and sensitivities were reviewed. The patient's                            tolerance of previous anesthesia was reviewed.                           - The risks and benefits of the procedure and the                            sedation options and risks were discussed with the                            patient. All questions were answered and informed                            consent was obtained.                           - ASA Grade Assessment: II - A patient with mild                            systemic disease.                           After obtaining informed consent, the colonoscope  was passed under direct vision. Throughout the                            procedure, the patient's blood pressure, pulse, and                            oxygen saturations were monitored continuously. The                            PCF-[id number] scope was introduced through                            the anus and advanced to the the cecum, identified                             by appendiceal orifice and ileocecal valve. The                            colonoscopy was technically difficult and complex                            due to a redundant colon and significant looping,                            especially in the [facility] [place]. Successful completion of the                            procedure was aided by applying abdominal pressure                            in the hypogastric area and positioning the patient                            on her back. Scope In: 10:13:31 AM Scope Out: 10:55:52 AM Scope Withdrawal Time: 0 hours 20 minutes 55 seconds  Total Procedure Duration: 0 hours 42 minutes 21 seconds  Findings:      The perianal and digital rectal examinations were normal.      A 5 mm polyp was found in the ascending colon, on the right side of the       IC valve, at the place of previous polypectomy. The polyp was       semi-sessile. The polyp was removed with a cold snare. Resection and       retrieval were complete.      The colon (entire examined portion) was significantly tortuous,       especially in the [facility]. Successful completion of the procedure was aided       by applying abdominal pressure in the hypogastric area and positioning       the patient on her back.      A 4 mm polyp was found in the sigmoid colon. The polyp was sessile. The       polyp was removed with a cold snare. Resection and retrieval were       complete.      Multiple small and large-mouthed diverticula were found in  the entire       colon.      Non-bleeding internal hemorrhoids were found during retroflexion. The       hemorrhoids were small. Impression:               - One 5 mm polyp in the ascending colon, removed                            with a cold snare. Resected and retrieved.                           - Tortuous colon.                           - One 4 mm polyp in the sigmoid colon, removed with                            a cold snare. Resected and retrieved.                            - Diverticulosis in the entire examined colon.                           - Non-bleeding internal hemorrhoids. Moderate Sedation:      Per Anesthesia Care Recommendation:           - Discharge patient to home (ambulatory).                           - Resume previous diet.                           - Await pathology results.                           - Repeat colonoscopy in 3 years for surveillance. Procedure Code(s):        --- Professional ---                           [phone], Colonoscopy, flexible; with removal of                            tumor(s), polyp(s), or other lesion(s) by snare                            technique Diagnosis Code(s):        --- Professional ---                           Z86.010, Personal history of colonic polyps                           K63.5, Polyp of colon                           K64.8, Other hemorrhoids  K57.30, Diverticulosis of large intestine without                            perforation or abscess without bleeding                           Q43.8, Other specified congenital malformations of                            intestine CPT copyright 2019 American Medical Association. All rights reserved. The codes documented in this report are preliminary and upon coder review may  be revised to meet current compliance requirements. Maylon Peppers, MD Maylon Peppers,  01/21/2021 2:00:54 PM This report has been signed electronically. Number of Addenda: 0

## 2021-01-21 NOTE — Transfer of Care (Signed)
Immediate Anesthesia Transfer of Care Note  Patient: Krista Lambert  Procedure(s) Performed: COLONOSCOPY WITH PROPOFOL POLYPECTOMY INTESTINAL  Patient Location: Endoscopy Unit  Anesthesia Type:General  Level of Consciousness: awake  Airway & Oxygen Therapy: Patient Spontanous Breathing  Post-op Assessment: Report given to RN  Post vital signs: Reviewed  Last Vitals:  Vitals Value Taken Time  BP    Temp    Pulse    Resp    SpO2      Last Pain:  Vitals:   01/21/21 1007  TempSrc:   PainSc: 0-No pain      Patients Stated Pain Goal: 4 [id number] [id number])  Complications: No notable events documented.

## 2021-01-21 NOTE — Anesthesia Postprocedure Evaluation (Signed)
Anesthesia Post Note  Patient: Krista Lambert  Procedure(s) Performed: COLONOSCOPY WITH PROPOFOL POLYPECTOMY INTESTINAL  Patient location during evaluation: PACU Anesthesia Type: General Level of consciousness: awake and alert Pain management: pain level controlled Vital Signs Assessment: post-procedure vital signs reviewed and stable Respiratory status: spontaneous breathing Cardiovascular status: blood pressure returned to baseline and stable Postop Assessment: no apparent nausea or vomiting Anesthetic complications: no   No notable events documented.   Last Vitals:  Vitals:   01/21/21 0840  BP: 139/74  Pulse: 89  Resp: 12  Temp: 36.8 C  SpO2: 98%    Last Pain:  Vitals:   01/21/21 1007  TempSrc:   PainSc: 0-No pain                 [name]

## 2021-01-21 NOTE — Anesthesia Preprocedure Evaluation (Signed)
Anesthesia Evaluation  Patient identified by MRN, date of birth, ID band Patient awake    Reviewed: Allergy & Precautions, NPO status , Patient's Chart, lab work & pertinent test results  History of Anesthesia Complications Negative for: history of anesthetic complications  Airway Mallampati: II  TM Distance: >3 FB Neck ROM: Full    Dental  (+) Dental Advisory Given, Teeth Intact Crowns :   Pulmonary neg pulmonary ROS,    Pulmonary exam normal breath sounds clear to auscultation       Cardiovascular Exercise Tolerance: Good Normal cardiovascular exam Rhythm:Regular Rate:Normal     Neuro/Psych negative neurological ROS  negative psych ROS   GI/Hepatic negative GI ROS, Neg liver ROS,   Endo/Other  diabetes, Well Controlled, Type 2, Oral Hypoglycemic AgentsHypothyroidism   Renal/GU      Musculoskeletal negative musculoskeletal ROS (+)   Abdominal   Peds  Hematology   Anesthesia Other Findings   Reproductive/Obstetrics negative OB ROS                             Anesthesia Physical  Anesthesia Plan  ASA: II  Anesthesia Plan: General   Post-op Pain Management:    Induction: Intravenous  PONV Risk Score and Plan: TIVA  Airway Management Planned: Nasal Cannula, Natural Airway and Simple Face Mask  Additional Equipment:   Intra-op Plan:   Post-operative Plan:   Informed Consent: I have reviewed the patients History and Physical, chart, labs and discussed the procedure including the risks, benefits and alternatives for the proposed anesthesia with the patient or authorized representative who has indicated his/her understanding and acceptance.     Dental advisory given  Plan Discussed with: CRNA and Surgeon  Anesthesia Plan Comments:         Anesthesia Quick Evaluation

## 2021-01-21 NOTE — H&P (Signed)
Krista Lambert is an 65 y.o. female.   Chief Complaint: surveillance after piecemeal polypectomy HPI: 65 y/o F with PMH diabetes, hyperlipidemia, hypothyroidism, coming to the hospital for surveillance after piecemeal polypectomy.  The patient underwent her most recent colonoscopy on 04/09/2020, 2 polyps were found in the ileocecal valve between 5 to 8 mm in size which were removed with a cold snare but one of the polyps were removed in a piecemeal fashion, there were 2 other polyps between 4 to 6 mm in size in the transverse colon and nonbleeding internal hemorrhoids.  Due to piecemeal polypectomy she is coming for surveillance after her procedure.  Patient denies having any complaints. The patient denies having any nausea, vomiting, fever, chills, hematochezia, melena, hematemesis, abdominal distention, abdominal pain, diarrhea, jaundice, pruritus or weight loss.  Past Medical History:  Diagnosis Date   Diabetes mellitus without complication ([place])    Hyperlipidemia    Hypothyroidism    Thyroid disease     Past Surgical History:  Procedure Laterality Date   COLONOSCOPY     COLONOSCOPY WITH PROPOFOL N/A 04/09/2020   Procedure: COLONOSCOPY WITH PROPOFOL;  Surgeon: Harvel Quale, MD;  Location: [facility];  Service: Gastroenterology;  Laterality: N/A;   FLEXIBLE SIGMOIDOSCOPY  04/08/2020   Procedure: FLEXIBLE SIGMOIDOSCOPY;  Surgeon: Montez Morita, Quillian Quince, MD;  Location: [facility];  Service: Gastroenterology;;   POLYPECTOMY  04/08/2020   Procedure: POLYPECTOMY;  Surgeon: Montez Morita, Quillian Quince, MD;  Location: [facility];  Service: Gastroenterology;;   POLYPECTOMY  04/09/2020   Procedure: POLYPECTOMY;  Surgeon: Montez Morita, Quillian Quince, MD;  Location: [facility];  Service: Gastroenterology;;    Family History  Problem Relation Age of Onset   Alzheimer's disease Father    Other Father        bowel obstruction   Cancer Sister        breast   Leukemia  Maternal Grandmother    Dementia Maternal Grandmother    Hypertension Maternal Grandfather    Cancer Maternal Grandfather        prostate   Alzheimer's disease Paternal Grandmother    Hyperlipidemia Son    Diabetes Son    Epilepsy Son    Other Son        heart issues   Alzheimer's disease Paternal Aunt    Alzheimer's disease Paternal Uncle    Alzheimer's disease Paternal Uncle    Cancer Paternal Uncle        liver   Hepatitis Paternal Uncle    Colon cancer Neg Hx    Social History:  reports that she has never smoked. She has never used smokeless tobacco. She reports previous alcohol use. She reports that she does not use drugs.  Allergies:  Allergies  Allergen Reactions   Miralax [Polyethylene Glycol]     "Ankle swelling"    Medications Prior to Admission  Medication Sig Dispense Refill   Ascorbic Acid (VITAMIN C) 1000 MG tablet Take 1,000 mg by mouth 2 (two) times daily.     aspirin 81 MG tablet Take 81 mg by mouth at bedtime.     Cholecalciferol (VITAMIN D) 125 MCG (5000 UT) CAPS Take 5,000 Units by mouth daily.      Coenzyme Q10 (COQ10) 200 MG CAPS Take 200 mg by mouth daily.     Cyanocobalamin (VITAMIN B-12) 5000 MCG SUBL Place 5,000 mcg under the tongue daily.     docusate sodium (COLACE) 100 MG capsule Take 100 mg by mouth 3 (three)  times daily.     fexofenadine (ALLEGRA) 180 MG tablet Take 180 mg by mouth daily.     fluticasone (FLONASE) 50 MCG/ACT nasal spray Place 2 sprays into both nostrils daily.     ibuprofen (ADVIL) 200 MG tablet Take 400 mg by mouth at bedtime.     levothyroxine (SYNTHROID) 75 MCG tablet Take 75 mcg by mouth daily before breakfast.     metFORMIN (GLUCOPHAGE-XR) 500 MG 24 hr tablet Take 1,000 mg by mouth daily with breakfast.      Multiple Vitamin (MULTIVITAMIN) tablet Take 1 tablet by mouth daily. 50+     Probiotic Product ([place]) CAPS Take 1 capsule by mouth at bedtime.     rosuvastatin (CRESTOR) 20 MG tablet Take 20 mg by  mouth daily.      senna-docusate (SENOKOT-S) 8.6-50 MG tablet Take 2 tablets by mouth at bedtime.     zinc gluconate 50 MG tablet Take 50 mg by mouth daily with lunch.      Results for orders placed or performed during the hospital encounter of 01/21/21 (from the past 48 hour(s))  Glucose, capillary     Status: Abnormal   Collection Time: 01/21/21  8:48 AM  Result Value Ref Range   Glucose-Capillary 110 (H) 70 - 99 mg/dL    Comment: Glucose reference range applies only to samples taken after fasting for at least 8 hours.   No results found.  Review of Systems  Constitutional: Negative.   HENT: Negative.    Eyes: Negative.   Respiratory: Negative.    Cardiovascular: Negative.   Gastrointestinal: Negative.   Endocrine: Negative.   Genitourinary: Negative.   Musculoskeletal: Negative.   Skin: Negative.   Allergic/Immunologic: Negative.   Neurological: Negative.   Hematological: Negative.   Psychiatric/Behavioral: Negative.     Blood pressure 139/74, pulse 89, temperature 98.3 F (36.8 C), temperature source Oral, resp. rate 12, height 5' 5.5" (1.664 m), weight 70.3 kg, SpO2 98 %. Physical Exam  GENERAL: The patient is AO x3, in no acute distress. HEENT: Head is normocephalic and atraumatic. EOMI are intact. Mouth is well hydrated and without lesions. NECK: Supple. No masses LUNGS: Clear to auscultation. No presence of rhonchi/wheezing/rales. Adequate chest expansion HEART: RRR, normal s1 and s2. ABDOMEN: Soft, nontender, no guarding, no peritoneal signs, and nondistended. BS +. No masses. EXTREMITIES: Without any cyanosis, clubbing, rash, lesions or edema. NEUROLOGIC: AOx3, no focal motor deficit. SKIN: no jaundice, no rashes  Assessment/Plan 65 y/o F with PMH diabetes, hyperlipidemia, hypothyroidism, coming to the hospital for surveillance after piecemeal polypectomy.  We will proceed with colonoscopy today.  Harvel Quale, MD 01/21/2021, 9:16 AM

## 2021-01-21 NOTE — Discharge Instructions (Addendum)
You are being discharged home Restart previous diet We are waiting for your pathology report You should have a repeat colonoscopy in 3 years

## 2021-01-22 ENCOUNTER — Encounter (INDEPENDENT_AMBULATORY_CARE_PROVIDER_SITE_OTHER): Payer: Self-pay | Admitting: *Deleted

## 2021-01-23 LAB — SURGICAL PATHOLOGY

## 2021-01-26 ENCOUNTER — Encounter (HOSPITAL_COMMUNITY): Payer: Self-pay | Admitting: Gastroenterology

## 2021-01-26 ENCOUNTER — Encounter (INDEPENDENT_AMBULATORY_CARE_PROVIDER_SITE_OTHER): Payer: Self-pay | Admitting: *Deleted

## 2021-03-23 ENCOUNTER — Other Ambulatory Visit (HOSPITAL_COMMUNITY): Payer: Self-pay | Admitting: Obstetrics & Gynecology

## 2021-03-23 DIAGNOSIS — Z1231 Encounter for screening mammogram for malignant neoplasm of breast: Secondary | ICD-10-CM

## 2021-04-02 ENCOUNTER — Ambulatory Visit (HOSPITAL_COMMUNITY)
Admission: RE | Admit: 2021-04-02 | Discharge: 2021-04-02 | Disposition: A | Discharge status: Home / Self Care | Payer: BC Managed Care – PPO | Source: Ambulatory Visit | Attending: Obstetrics & Gynecology | Admitting: Obstetrics & Gynecology

## 2021-04-02 ENCOUNTER — Other Ambulatory Visit: Payer: Self-pay

## 2021-04-02 DIAGNOSIS — Z1231 Encounter for screening mammogram for malignant neoplasm of breast: Secondary | ICD-10-CM | POA: Insufficient documentation

## 2022-05-10 ENCOUNTER — Other Ambulatory Visit (HOSPITAL_COMMUNITY): Payer: Self-pay | Admitting: Family Medicine

## 2022-05-10 DIAGNOSIS — Z1231 Encounter for screening mammogram for malignant neoplasm of breast: Secondary | ICD-10-CM

## 2022-05-28 ENCOUNTER — Ambulatory Visit (HOSPITAL_COMMUNITY)
Admission: RE | Admit: 2022-05-28 | Discharge: 2022-05-28 | Disposition: A | Discharge status: Home / Self Care | Payer: BC Managed Care – PPO | Source: Ambulatory Visit | Attending: Family Medicine | Admitting: Family Medicine

## 2022-05-28 ENCOUNTER — Other Ambulatory Visit (HOSPITAL_COMMUNITY): Payer: Self-pay | Admitting: Family Medicine

## 2022-05-28 DIAGNOSIS — Z1231 Encounter for screening mammogram for malignant neoplasm of breast: Secondary | ICD-10-CM

## 2023-01-30 IMAGING — MG MM DIGITAL SCREENING BILAT W/ TOMO AND CAD
8 series · 9 of 24 positions shown · non-contrast
Comparison: Previous exam(s).

CLINICAL DATA: Screening.

EXAM:
DIGITAL SCREENING BILATERAL MAMMOGRAM WITH TOMOSYNTHESIS AND CAD
TECHNIQUE: Bilateral screening digital craniocaudal and mediolateral oblique
mammograms were obtained. Bilateral screening digital breast
tomosynthesis was performed. The images were evaluated with
computer-aided detection.

[R MLO synth-2D]
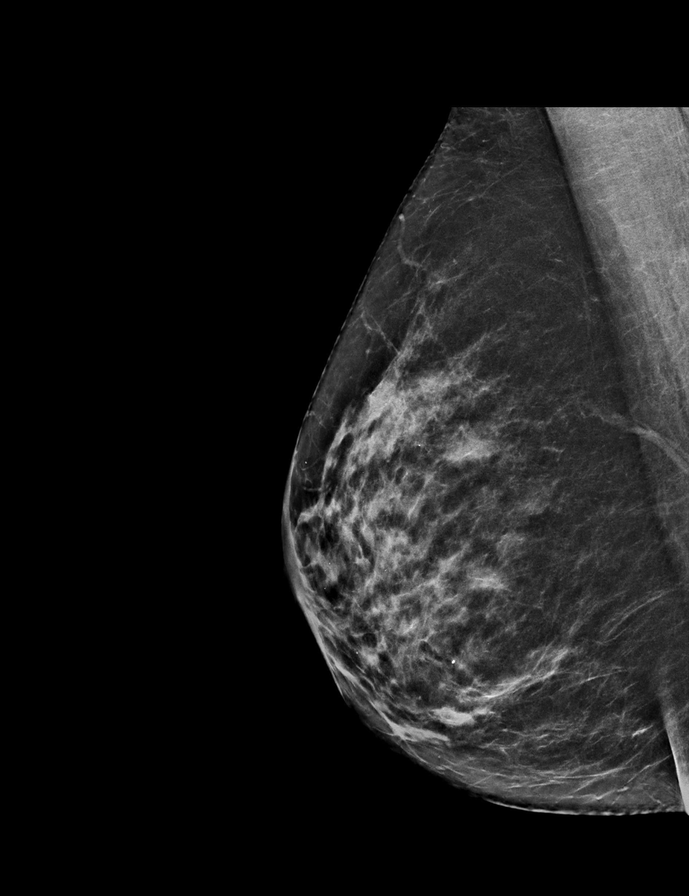

[L CC synth-2D]
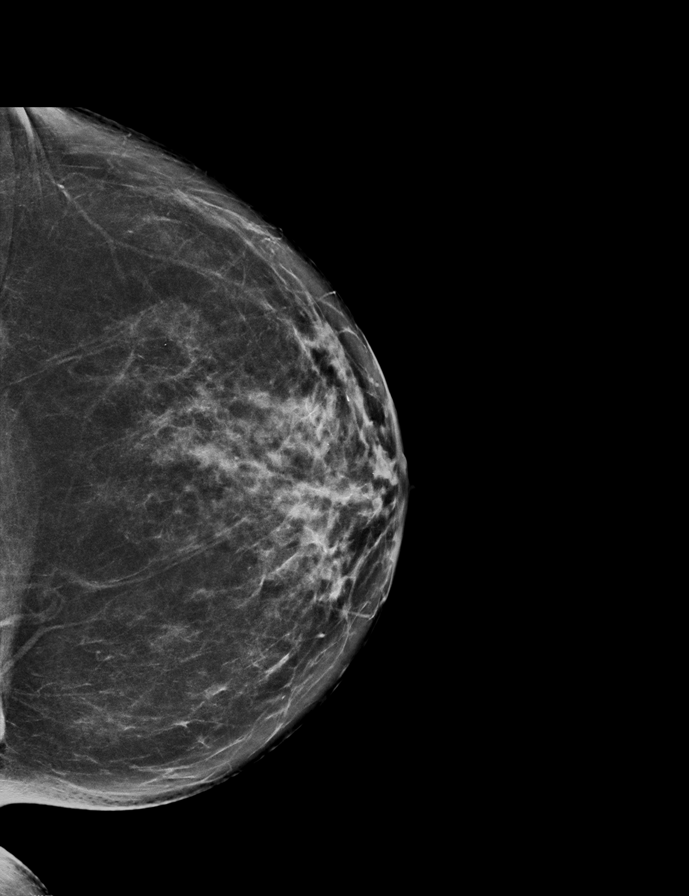

[L MLO synth-2D]
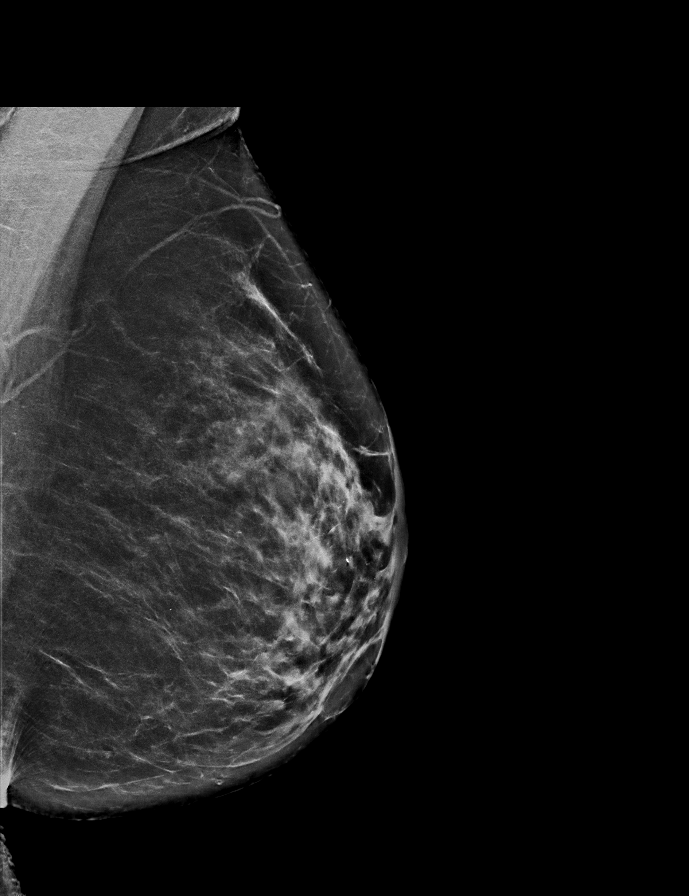

[R CC synth-2D]
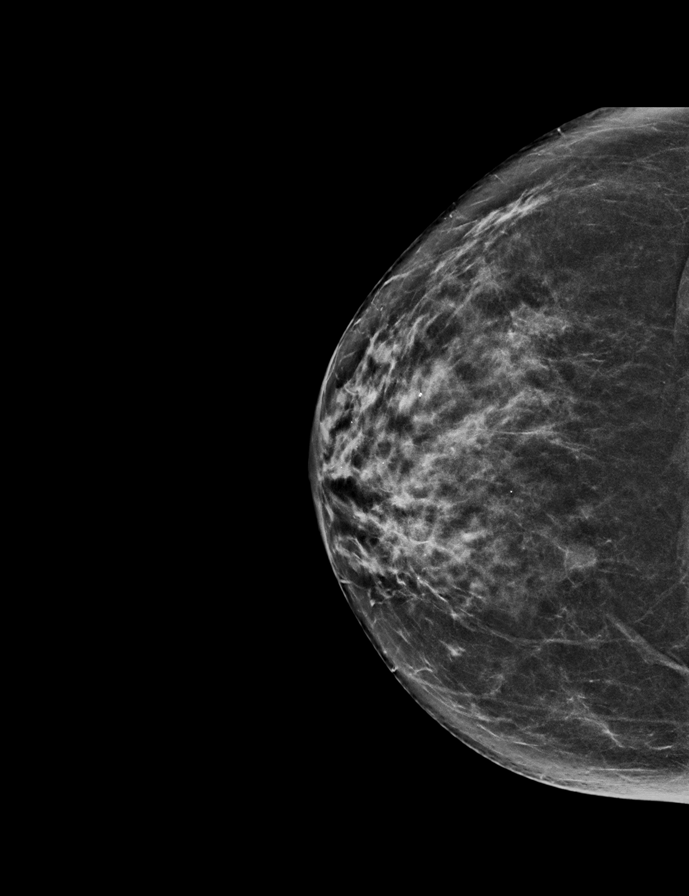

[L MLO tomo · 2 of 71 frames shown]
[frame 23/71]
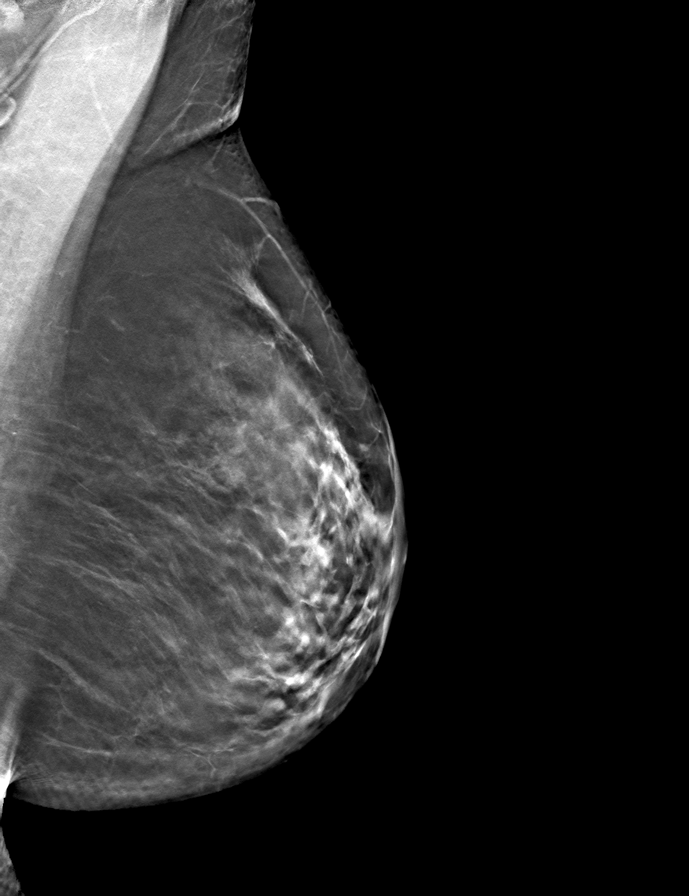
[frame 36/71]
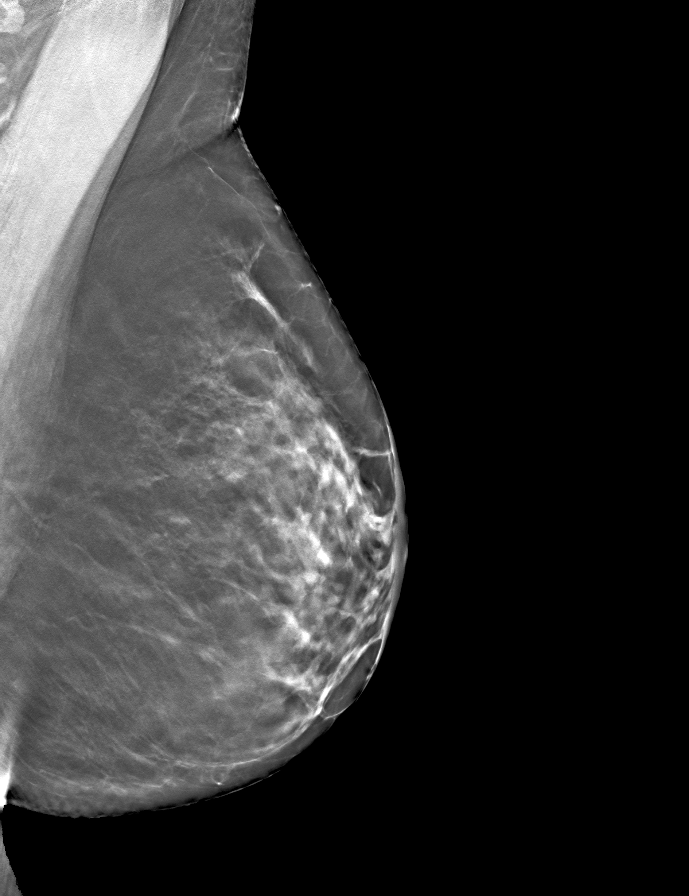

[R MLO tomo · tomo slice 33/66.0]
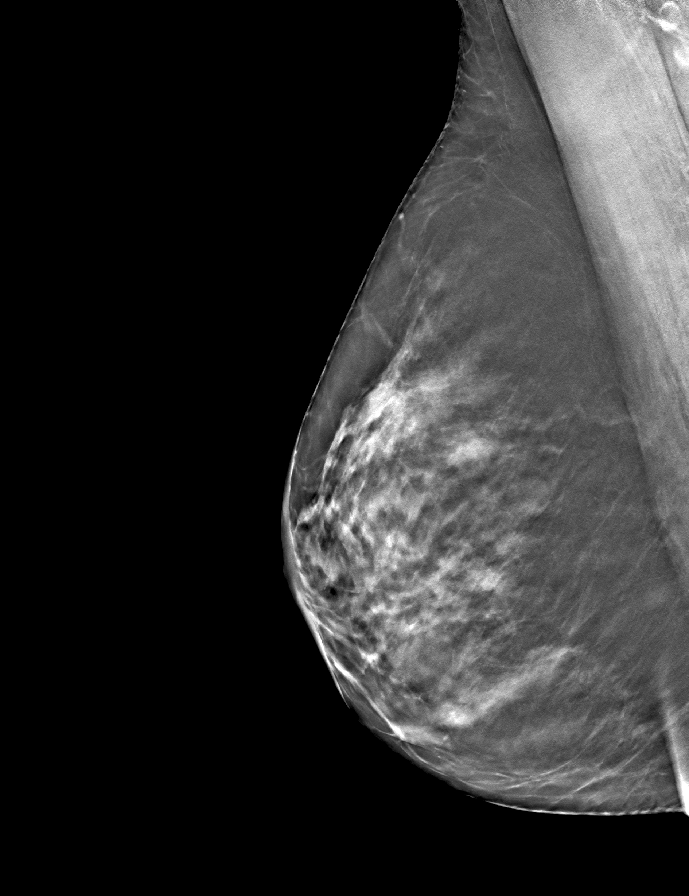

[L CC tomo · tomo slice 33/66.0]
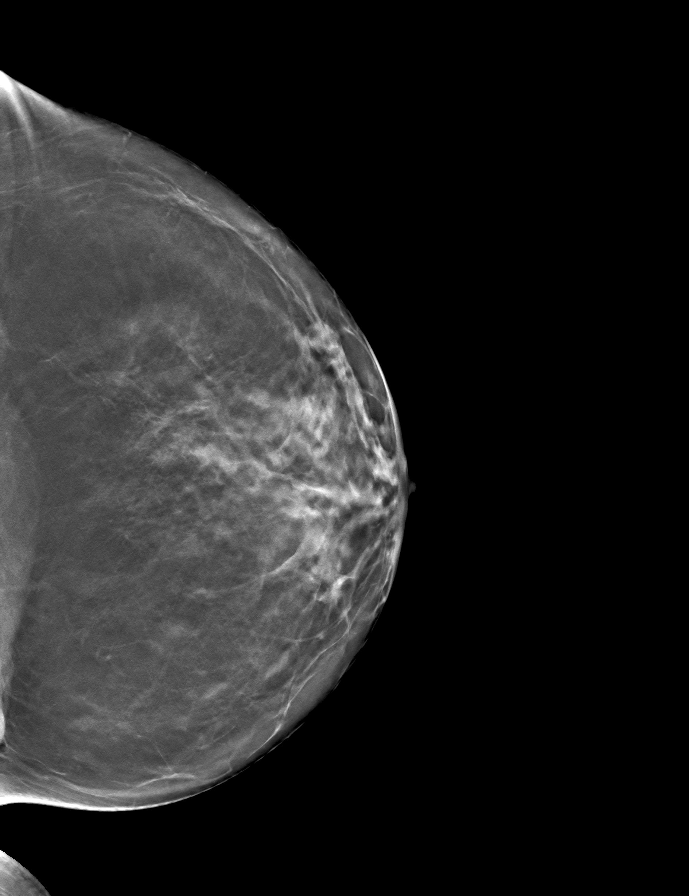

[R CC tomo · tomo slice 31/62.0]
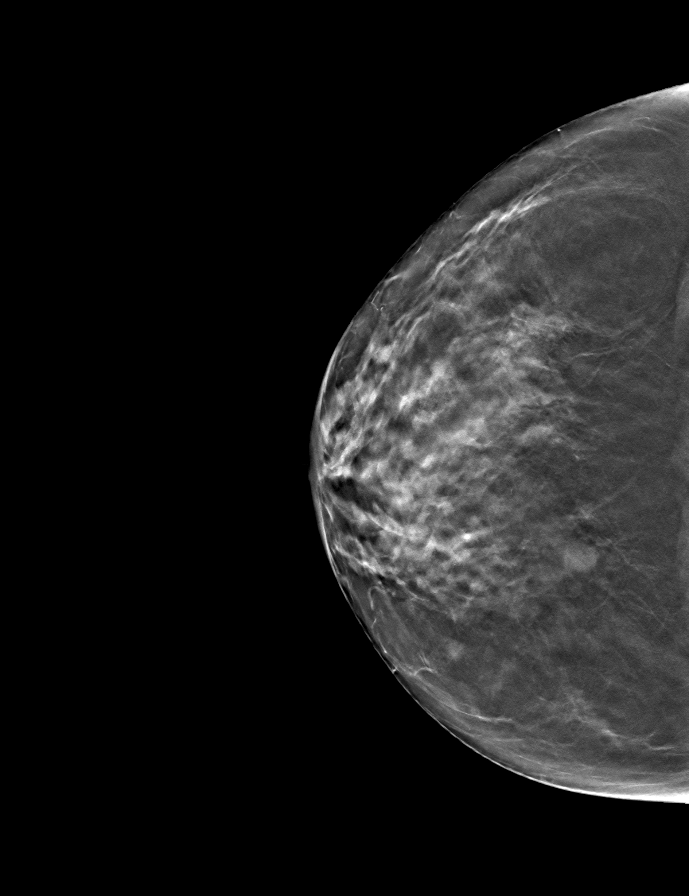

[9 of 24 positions shown; findings below may reference images not displayed]

ACR Breast Density Category c: The breast tissue is heterogeneously
dense, which may obscure small masses.
FINDINGS: There are no findings suspicious for malignancy.
IMPRESSION: No mammographic evidence of malignancy. A result letter of this
screening mammogram will be mailed directly to the patient.

RECOMMENDATION:
Screening mammogram in one year. (Code:Q3-W-BC3)

BI-RADS CATEGORY  1: Negative.

## 2023-12-02 ENCOUNTER — Encounter (INDEPENDENT_AMBULATORY_CARE_PROVIDER_SITE_OTHER): Payer: Self-pay | Admitting: *Deleted
